# Patient Record
Sex: Female | Born: 1970
Health system: Southern US, Community
[De-identification: ages and names within clinical notes are randomized; demographics above are authoritative.]

## PROBLEM LIST (undated history)

## (undated) DIAGNOSIS — J45909 Unspecified asthma, uncomplicated: Secondary | ICD-10-CM

## (undated) DIAGNOSIS — T7840XA Allergy, unspecified, initial encounter: Secondary | ICD-10-CM

## (undated) HISTORY — DX: Unspecified asthma, uncomplicated: J45.909

## (undated) HISTORY — DX: Allergy, unspecified, initial encounter: T78.40XA

## (undated) HISTORY — PX: TONSILLECTOMY: SUR1361

---

## 2001-05-06 ENCOUNTER — Emergency Department (HOSPITAL_COMMUNITY): Admission: EM | Admit: 2001-05-06 | Discharge: 2001-05-06 | Payer: Self-pay | Admitting: Emergency Medicine

## 2003-11-03 ENCOUNTER — Inpatient Hospital Stay (HOSPITAL_COMMUNITY): Admission: AD | Admit: 2003-11-03 | Discharge: 2003-11-04 | Payer: Self-pay | Admitting: Emergency Medicine

## 2004-09-22 ENCOUNTER — Other Ambulatory Visit: Admission: RE | Admit: 2004-09-22 | Discharge: 2004-09-22 | Payer: Self-pay | Admitting: Obstetrics and Gynecology

## 2004-10-27 ENCOUNTER — Ambulatory Visit (HOSPITAL_COMMUNITY): Admission: RE | Admit: 2004-10-27 | Discharge: 2004-10-27 | Payer: Self-pay | Admitting: Obstetrics and Gynecology

## 2005-04-09 ENCOUNTER — Emergency Department (HOSPITAL_COMMUNITY): Admission: EM | Admit: 2005-04-09 | Discharge: 2005-04-10 | Payer: Self-pay | Admitting: Emergency Medicine

## 2005-11-26 IMAGING — CR DG CHEST 2V
2 series · 2 of 2 positions shown · non-contrast
Comparison: none

CLINICAL DATA: Chest pain and shortness of breath.  History of asthma.
 CHEST ? 2 VIEWS:
 Hyperaeration of the lungs  Pectus excavatum deformity compressing central aspect of the heart and accentuating its transverse diameter.  Peribronchial thickening likely chronic.  It is difficult to exclude mild vascular congestion.  Comparison will be made with prior chest x-ray dated 11/04/03 and addendum report rendered.

[view not recorded (1 of 2)]
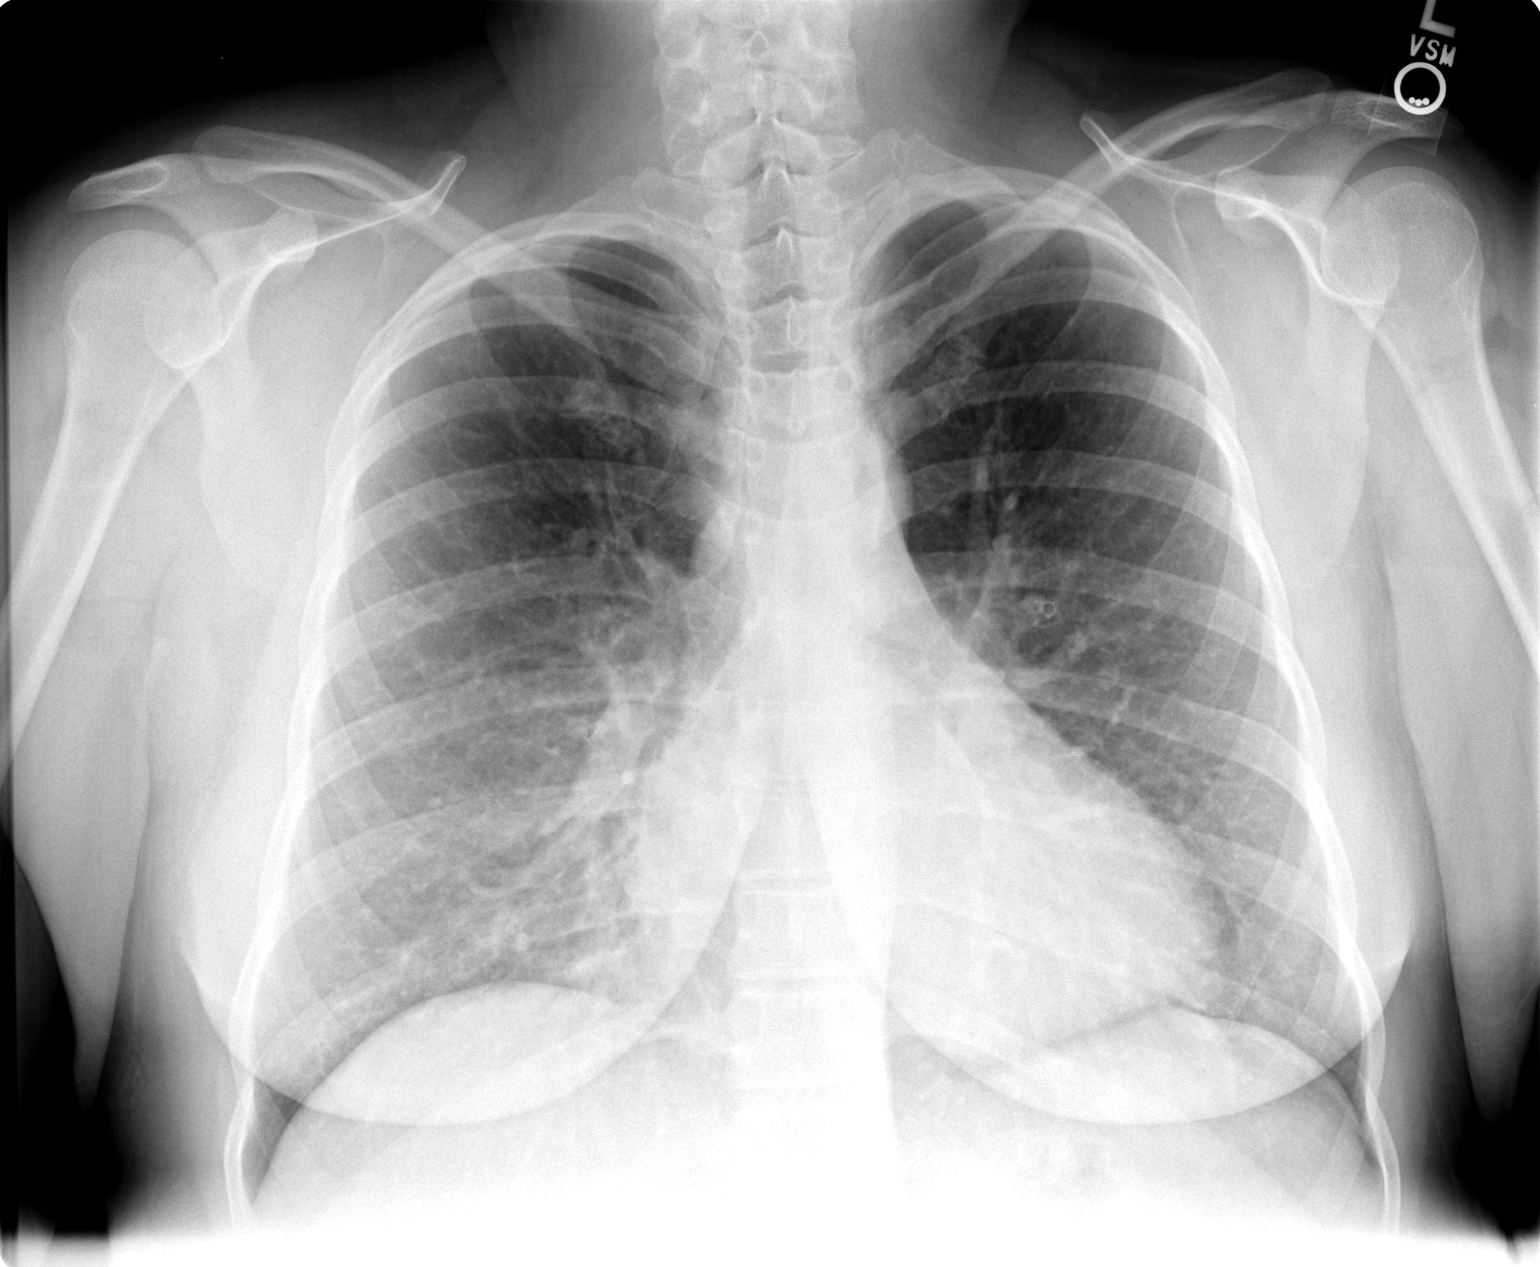

[view not recorded (2 of 2)]
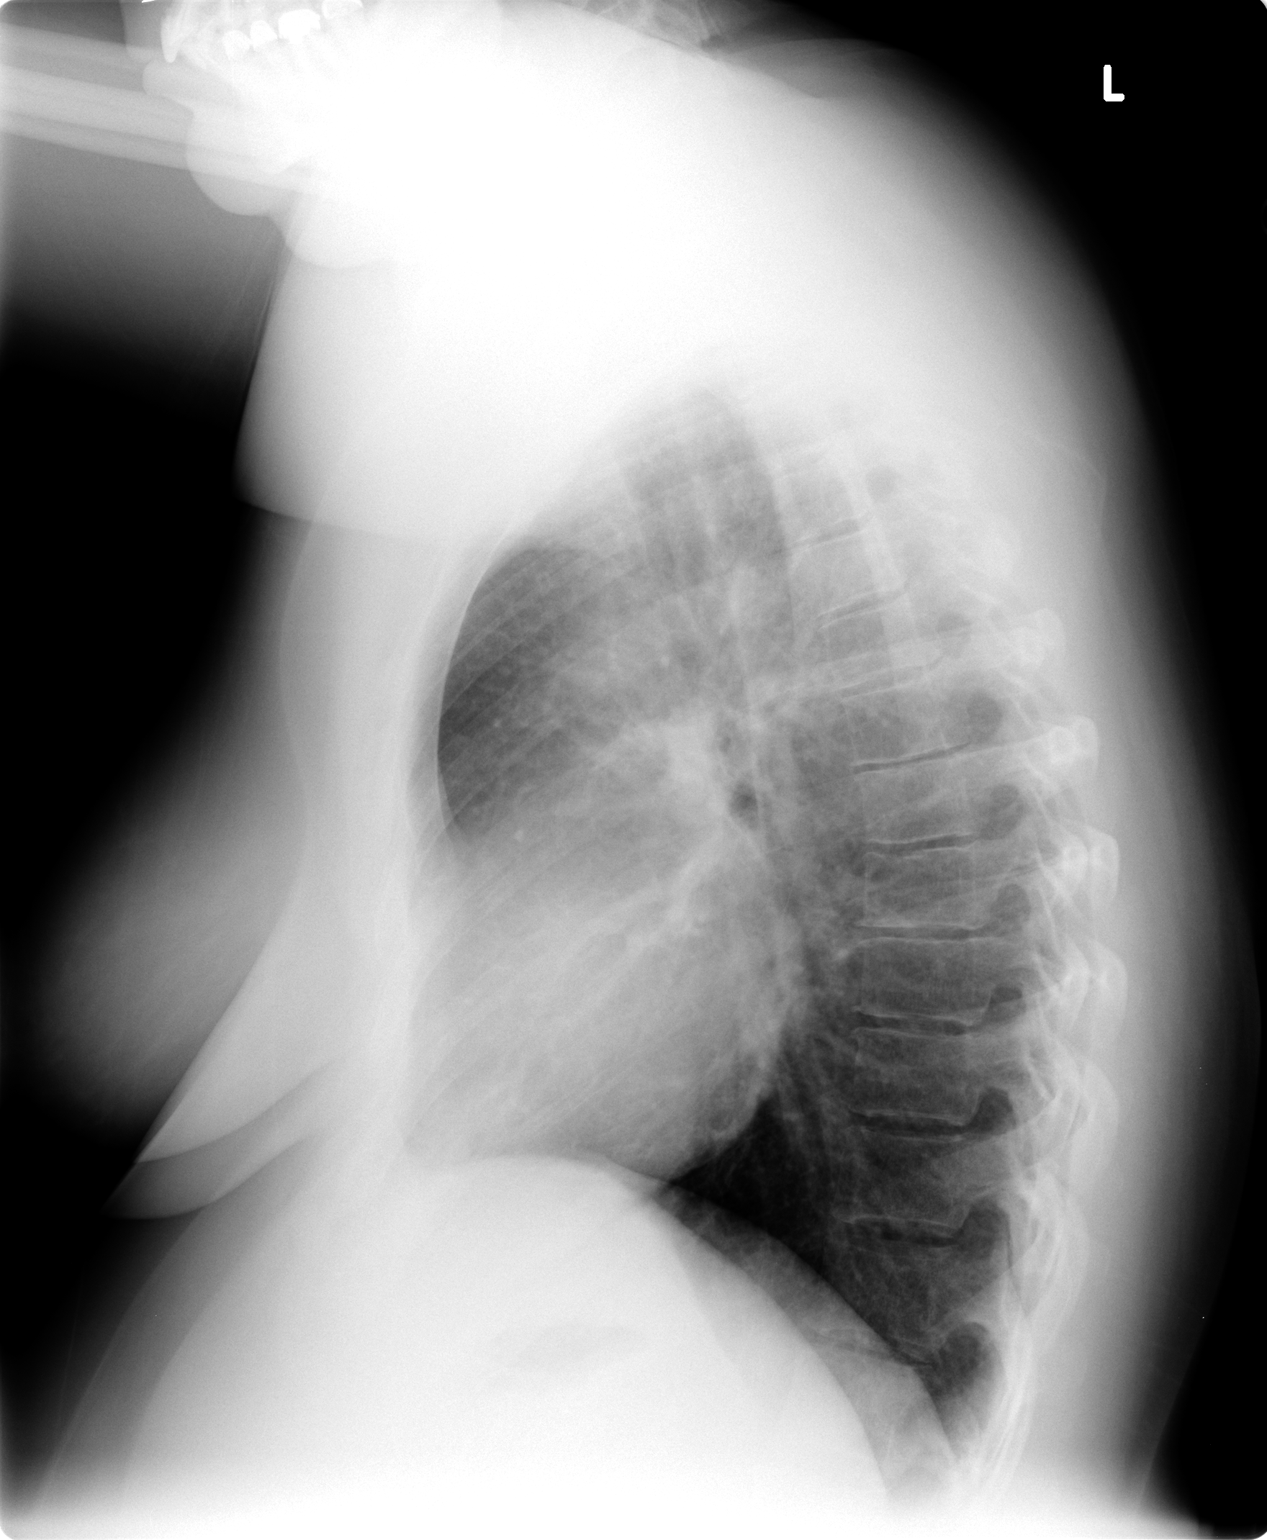

[2 of 2 positions shown; findings below may reference images not displayed]

IMPRESSION: Findings compatible with asthma/bronchitis.  Heart is likely mildly enlarged but appears accentuated on PA view due to effects of pectus excavatum.  Query an element of vascular congestion.  No overt pulmonary edema.  Bronchitic changes.  Hyperaeration.

## 2008-04-30 ENCOUNTER — Ambulatory Visit: Payer: Self-pay

## 2011-05-26 HISTORY — PX: ACHILLES TENDON LENGTHENING: SUR826

## 2011-07-10 ENCOUNTER — Encounter: Payer: Self-pay | Admitting: Podiatry

## 2011-08-01 ENCOUNTER — Encounter: Payer: Self-pay | Admitting: Podiatry

## 2011-09-01 ENCOUNTER — Encounter: Payer: Self-pay | Admitting: Podiatry

## 2011-11-06 ENCOUNTER — Encounter: Payer: Self-pay | Admitting: Podiatry

## 2011-12-01 ENCOUNTER — Encounter: Payer: Self-pay | Admitting: Podiatry

## 2012-01-01 ENCOUNTER — Encounter: Payer: Self-pay | Admitting: Podiatry

## 2012-02-26 ENCOUNTER — Encounter: Payer: Self-pay | Admitting: Podiatry

## 2012-02-29 ENCOUNTER — Encounter: Payer: Self-pay | Admitting: Podiatry

## 2012-03-31 ENCOUNTER — Encounter: Payer: Self-pay | Admitting: Podiatry

## 2012-04-30 ENCOUNTER — Encounter: Payer: Self-pay | Admitting: Podiatry

## 2012-05-31 ENCOUNTER — Encounter: Payer: Self-pay | Admitting: Podiatry

## 2017-02-13 ENCOUNTER — Encounter: Payer: Self-pay | Admitting: Emergency Medicine

## 2017-02-13 ENCOUNTER — Emergency Department
Admission: EM | Admit: 2017-02-13 | Discharge: 2017-02-13 | Disposition: A | Discharge status: Home / Self Care | Payer: 59 | Attending: Emergency Medicine | Admitting: Emergency Medicine

## 2017-02-13 DIAGNOSIS — R05 Cough: Secondary | ICD-10-CM | POA: Diagnosis present

## 2017-02-13 DIAGNOSIS — J111 Influenza due to unidentified influenza virus with other respiratory manifestations: Secondary | ICD-10-CM | POA: Insufficient documentation

## 2017-02-13 DIAGNOSIS — F17200 Nicotine dependence, unspecified, uncomplicated: Secondary | ICD-10-CM | POA: Diagnosis not present

## 2017-02-13 DIAGNOSIS — J4521 Mild intermittent asthma with (acute) exacerbation: Secondary | ICD-10-CM | POA: Insufficient documentation

## 2017-02-13 MED ORDER — ACETAMINOPHEN 500 MG PO TABS
ORAL_TABLET | ORAL | Status: AC
  Filled 2017-02-13: qty 2

## 2017-02-13 MED ORDER — IPRATROPIUM-ALBUTEROL 0.5-2.5 (3) MG/3ML IN SOLN
RESPIRATORY_TRACT | Status: AC
  Administered 2017-02-13: 3 mL via RESPIRATORY_TRACT
  Filled 2017-02-13: qty 3

## 2017-02-13 MED ORDER — METHYLPREDNISOLONE SODIUM SUCC 125 MG IJ SOLR
125.0000 mg | Freq: Once | INTRAMUSCULAR | Status: AC
  Administered 2017-02-13: 125 mg via INTRAMUSCULAR

## 2017-02-13 MED ORDER — OSELTAMIVIR PHOSPHATE 75 MG PO CAPS: 75.0000 mg | ORAL_CAPSULE | Freq: Two times a day (BID) | ORAL | 0 refills | Status: AC

## 2017-02-13 MED ORDER — PREDNISONE 10 MG (21) PO TBPK: 10.0000 mg | ORAL_TABLET | Freq: Every day | ORAL | 0 refills | Status: AC

## 2017-02-13 MED ORDER — METHYLPREDNISOLONE SODIUM SUCC 125 MG IJ SOLR
INTRAMUSCULAR | Status: AC
  Administered 2017-02-13: 125 mg via INTRAMUSCULAR
  Filled 2017-02-13: qty 2

## 2017-02-13 MED ORDER — IPRATROPIUM-ALBUTEROL 0.5-2.5 (3) MG/3ML IN SOLN
3.0000 mL | Freq: Once | RESPIRATORY_TRACT | Status: AC
  Administered 2017-02-13: 3 mL via RESPIRATORY_TRACT

## 2017-02-13 MED ORDER — ACETAMINOPHEN 500 MG PO TABS
1000.0000 mg | ORAL_TABLET | Freq: Once | ORAL | Status: AC
  Administered 2017-02-13: 1000 mg via ORAL

## 2017-02-13 NOTE — ED Provider Notes (Signed)
[facility] Emergency Department Provider Note  ____________________________________________  Time seen: Approximately 10:12 PM  I have reviewed the triage vital signs and the nursing notes.   HISTORY  Chief Complaint Fever; Cough; and Chills    HPI Mardene Lessig Scaff is a 46 y.o. female with a history of asthma presents to the emergency department with headache, nonproductive cough, SOB, congestion, fever, myalgias and chills that started today. Fever has been as high as 102F assessed orally. Patient states that she is tolerating fluids by mouth. She has had diminished appetite and energy. No recent travel. Patient denies chest pain, chest tightness, abdominal pain, nausea and vomiting. Patient has taken Ibuprofen and mucinex.     History reviewed. No pertinent past medical history.  There are no active problems to display for this patient.   History reviewed. No pertinent surgical history.  Prior to Admission medications   Medication Sig Start Date End Date Taking? Authorizing Provider  oseltamivir (TAMIFLU) 75 MG capsule Take 1 capsule (75 mg total) by mouth 2 (two) times daily. 02/13/17 02/18/17  Orvil Feil, PA-C    Allergies Patient has no known allergies.  History reviewed. No pertinent family history.  Social History Social History  Substance Use Topics  . Smoking status: Current Every Day Smoker  . Smokeless tobacco: Never Used  . Alcohol use No     Review of Systems  Constitutional: Patient has had fever.  Eyes: No visual changes. No discharge ENT: Patient has had congestion.  Cardiovascular: no chest pain. Respiratory: Patient has had non-productive cough.  No SOB. Gastrointestinal: Patient denies nausea and diarrhea. Genitourinary: Negative for dysuria. No hematuria Musculoskeletal: Patient has had myalgias. Skin: Negative for rash, abrasions, lacerations, ecchymosis. Neurological: Patient has headache, no focal weakness or  numbness. ____________________________________________   PHYSICAL EXAM:  VITAL SIGNS: ED Triage Vitals [02/13/17 2035]  Enc Vitals Group     BP (!) 141/55     Pulse Rate 90     Resp 16     Temp (!) 102.9 F (39.4 C)     Temp Source Oral     SpO2 99 %     Weight 240 lb (108.9 kg)     Height 5\' 10"  (1.778 m)     Head Circumference      Peak Flow      Pain Score      Pain Loc      Pain Edu?      Excl. in GC?    Constitutional: Alert and oriented. Patient is lying supine in bed.  Eyes: Conjunctivae are normal. PERRL. EOMI. Head: Atraumatic. ENT:      Ears: Tympanic membranes are injected bilaterally without evidence of effusion or purulent exudate. Bony landmarks are visualized bilaterally. No pain with palpation at the tragus.      Nose: Nasal turbinates are edematous and erythematous. Copious rhinorrhea visualized.      Mouth/Throat: Mucous membranes are moist. Posterior pharynx is mildly erythematous. No tonsillar hypertrophy or purulent exudate. Uvula is midline. Neck: Full range of motion. No pain is elicited with flexion at the neck. Hematological/Lymphatic/Immunilogical: No cervical lymphadenopathy. Cardiovascular: Normal rate, regular rhythm. Normal S1 and S2.  Good peripheral circulation. Respiratory: Normal respiratory effort without tachypnea or retractions. Patient has mild wheezing auscultated at the lung bases bilaterally. Wheezing improved to auscultation after DuoNeb treatment. Good air entry to the bases with no decreased or absent breath sounds. Gastrointestinal: Bowel sounds 4 quadrants. Soft and nontender to palpation. No guarding  or rigidity. No palpable masses. No distention. No CVA tenderness.  Skin:  Skin is warm, dry and intact. No rash noted. Psychiatric: Mood and affect are normal. Speech and behavior are normal. Patient exhibits appropriate insight and judgement. ____________________________________________   LABS (all labs ordered are listed, but  only abnormal results are displayed)  Labs Reviewed - No data to display ____________________________________________  EKG   ____________________________________________  RADIOLOGY   No results found.  ____________________________________________    PROCEDURES  Procedure(s) performed:    Procedures    Medications  acetaminophen (TYLENOL) tablet 1,000 mg (1,000 mg Oral Given 02/13/17 2038)  methylPREDNISolone sodium succinate (SOLU-MEDROL) 125 mg/2 mL injection 125 mg (125 mg Intramuscular Given 02/13/17 2216)  ipratropium-albuterol (DUONEB) 0.5-2.5 (3) MG/3ML nebulizer solution 3 mL (3 mLs Nebulization Given 02/13/17 2217)     ____________________________________________   INITIAL IMPRESSION / ASSESSMENT AND PLAN / ED COURSE  Pertinent labs & imaging results that were available during my care of the patient were reviewed by me and considered in my medical decision making (see chart for details).  Review of the [place] CSRS was performed in accordance of the [facility] prior to dispensing any controlled drugs.     Assessment and Plan:  Influenza: Asthma Exacerbation  Patient presents to the emergency department with headache, rhinorrhea, congestion, SOB, myalgias, fatigue and diarrhea. Symptoms are consistent with influenza. On physical exam, patient had mild diffuse wheezing auscultated at the lung bases, which improved to auscultation after DuoNeb treatment. She was given Solu-Medrol in the emergency department. Patient was discharged with tapered prednisone. Tamiflu was also prescribed at discharge. Rest and hydration were encouraged. Patient was advised to follow-up with her primary care provider in one week. Physical exam vital signs are reassuring at this time. All patient questions were answered.   ____________________________________________  FINAL CLINICAL IMPRESSION(S) / ED DIAGNOSES  Final diagnoses:  Influenza  Mild intermittent asthma with exacerbation       NEW MEDICATIONS STARTED DURING THIS VISIT:  New Prescriptions   OSELTAMIVIR (TAMIFLU) 75 MG CAPSULE    Take 1 capsule (75 mg total) by mouth 2 (two) times daily.        This chart was dictated using voice recognition software/Dragon. Despite best efforts to proofread, errors can occur which can change the meaning. Any change was purely unintentional.    Orvil FeilJaclyn M Woods, PA-C 02/14/17 0020    Governor Rooksebecca Lord, MD 02/16/17 [id number]

## 2017-02-13 NOTE — ED Notes (Signed)
See triage note, pt reports today she woke up feeling sore, having a fever as well as a cough. Pt's significant other is in room and reports he has just gotten over the same. Pt states she has taken mucinex, ibuprofen without relief. Pt denies emesis. Pt states she is a smoker.

## 2017-02-13 NOTE — ED Triage Notes (Signed)
Pt to triage in wheelchair. Pt c/o cough, fever, chills x1 day. Pt sts she last took tylenol at 1600 today. Pt has fever of 102.39F in triage.

## 2018-02-20 ENCOUNTER — Other Ambulatory Visit: Payer: Self-pay

## 2018-02-20 ENCOUNTER — Ambulatory Visit: Payer: 59 | Attending: Orthopedic Surgery | Admitting: Physical Therapy

## 2018-02-20 ENCOUNTER — Encounter: Payer: Self-pay | Admitting: Physical Therapy

## 2018-02-20 DIAGNOSIS — M6281 Muscle weakness (generalized): Secondary | ICD-10-CM | POA: Diagnosis present

## 2018-02-20 DIAGNOSIS — R262 Difficulty in walking, not elsewhere classified: Secondary | ICD-10-CM | POA: Diagnosis present

## 2018-02-21 NOTE — Therapy (Signed)
[facility] [address] [place], [address] Phone: [phone]   Fax:  [phone]  Physical Therapy Evaluation  Patient Details  Name: Stephanie Pugh MRN: [id number] Date of Birth: 1971/10/23 Referring Provider: Margarita Rana MD   Encounter Date: 02/20/2018  PT End of Session - 02/20/18 1908    Visit Number  1    Number of Visits  12    Date for PT Re-Evaluation  04/03/18    PT Start Time  1750    PT Stop Time  1900    PT Time Calculation (min)  70 min    Activity Tolerance  Patient tolerated treatment well    Behavior During Therapy  [facility] for tasks assessed/performed       Past Medical History:  Diagnosis Date  . Allergy   . Asthma     Past Surgical History:  Procedure Laterality Date  . ACHILLES TENDON LENGTHENING Left 05/26/2011    There were no vitals filed for this visit.   Subjective Assessment - 02/20/18 1814    Subjective  Patient reports she is having difficulty with being up on feet with prolonged standing 30 - 60 minutes, difficulty with stairs, balance and is unable to run, go up on toes.     Pertinent History         may 2012 underwent Achilles tendon lengthening; She had complications during healing with falling; she was placed back in boot and went back to work, had increased pain, tearing of tendon and siince then she has had difficulty with strength, endurance, walking, standing. She was seen by Dr. Eulah Pont recently and was told no surgery was indicated and she is now referred to out patient PT for strengthening   Limitations  Patient Stated Goals  Standing;Walking;House hold activities;Other (comment) difficulty with going up on toes for any activity  to improve strength and function as much as possible    Currently in Pain?  No/denies 0/10; worst pain 8/10    Aggravating Factors   standing,, walking, going up on toes    Pain Relieving Factors  rest, ice    Effect of Pain  on Daily Activities  difficulty with walking and standing activities         [facility] PT Assessment - 02/20/18 1810      Assessment   Medical Diagnosis  Achilles Tendonopathy left     Referring Provider  Margarita Rana MD    Onset Date/Surgical Date  -- 05/2011 (Achilles lengthening) tore Achilles tendon x 2 post surgery    Prior Therapy  yes; following surgery and following tendon tear      Precautions   Precautions  None      Restrictions   Weight Bearing Restrictions  No      Balance Screen   Has the patient fallen in the past 6 months  No      Prior Function   Level of Independence  Independent    Vocation  Full time employment    Vocation Requirements  standing, climbing ladders, walking     Leisure  going out       Cognition   Overall Cognitive Status  Within Functional Limits for tasks assessed      Observation/Other Assessments   Focus on Therapeutic Outcomes (FOTO)   45    Other Surveys   -- FADI 26%      ROM / Strength   AROM / PROM / Strength  AROM;Strength      AROM   Overall AROM Comments  bilateral LE's grossly WFL throughout       Strength   Overall Strength Comments  right LE WNL major muscle groups; left LE hip abduction 4-/5, ER 4-/5, extension 4-/5, knee extensoin 4-/5, flexion 4-/5, ankle DF 4/5, eversion 3/5, PF 3-/5      Palpation   Palpation comment  decreased soft tissue elasticity left ankle, Achilles tendon, calf muscle      Ambulation/Gait   Gait Comments  decreased trunk rotation, decreased push off left LE      Balance   Balance Assessed  Yes single leg balance left 22 seconds, right 22 seconds         Objective measurements completed on examination: See above findings.    Treatment: Therapeutic exercise: patient performed with demonstration, verbal and tactil cues of therapist: goal; improved function with standing, daily tasks, independent with home program  Sitting: SLR with foot everted and knee relaxed with quad set  controlled; x 5 Seated hip adduction with ball and glute sets x 10 Hip abduction with resistive band x 15 Bilateral ankle eversion with yellow resistive band, DF with yellow resistive band x 15 each  Patient response to treatment: patient demonstrated improved technique with exercises with minimal VC for correct alignment.    PT Education - 02/20/18 1946    Education provided  Yes    Education Details  HEP; POC: seated hip adduction with glute sets, hip abduction with resistive band, ankle eversion wtih resistive band, SLR    Person(s) Educated  Patient    Methods  Explanation;Demonstration;Verbal cues;Handout    Comprehension  Verbal cues required;Returned demonstration;Verbalized understanding          PT Long Term Goals - 02/20/18 1915      PT LONG TERM GOAL #1   Title  Patient will demonstrate improved function with daily tasks as indicated by FOTO score of 50 and foot/ankle disability index score of 25% or better    Baseline  FOTO 45; FADI 36%    Status  New    Target Date  03/13/18      PT LONG TERM GOAL #2   Title  Patient will demonstrate improved function with daily tasks as indicated by FOTO score of 55 and foot/ankle disability index score of 20% or better    Baseline  FOTO 45; FADI 36%    Status  New    Target Date  04/03/18      PT LONG TERM GOAL #3   Title  Patient will be independent with home program for strength, balance, flexibility to transition to self management at discharge     Baseline  limited knowledge of appropriate exerises, progression and requires assistance, cuing    Status  New    Target Date  04/03/18             Plan - 02/20/18 1931    Clinical Impression Statement  Patient is a 47 year old female who presents with left ankle weakness, decreased endurance and limited function with daily tasks. She has FOTO score of 45 and a foot/ankle disability index of 36% indicating moderate self perceived disability. She has left LE weakness and  decreased balance and limited knowledge of appropriate exercises and progression and will benefit from physical therapy intervention.     History and Personal Factors relevant to plan of care:  May 2012 underwent Achilles tendon lengthening; She had complications during healing with  falling; she was placed back in boot and went back to work, had increased pain, tearing of tendon and siince then she has had difficulty with strength, endurance, walking, standing. She was seen by Dr. Eulah PontMurphy recently and is now referred to out patient PT for strengthening.     Clinical Presentation  Stable    Clinical Presentation due to:  no changes in pain, weakness    Clinical Decision Making  Low    Rehab Potential  Good    Clinical Impairments Affecting Rehab Potential  (+)motivated, age (-) chronic condition, multiple injuries and surgery left snkle >5 years ago    PT Frequency  2x / week    PT Duration  6 weeks    PT Treatment/Interventions  Manual techniques;Neuromuscular re-education;Patient/family education;Balance training;Therapeutic exercise;Moist Heat;Ultrasound;Electrical Stimulation;Cryotherapy    PT Next Visit Plan  modalities for neuromuscular re education; therapeutic exercises    PT Home Exercise Plan  hip adduction with glute sets, hip abduction with resistive band, SLR, ankle eversion with resistive band    Consulted and Agree with Plan of Care  Patient       Patient will benefit from skilled therapeutic intervention in order to improve the following deficits and impairments:  Pain, Decreased activity tolerance, Decreased endurance, Decreased strength, Impaired perceived functional ability, Difficulty walking, Decreased balance  Visit Diagnosis: Muscle weakness (generalized) - Plan: PT plan of care cert/re-cert  Difficulty in walking, not elsewhere classified - Plan: PT plan of care cert/re-cert     Problem List There are no active problems to display for this patient.   Beacher MayBrooks, Kei Mcelhiney  PT 02/21/2018, 8:24 PM  [facility] [address] [place], [place], [address] Phone: [phone]   Fax:  [phone]  Name: Randon GoldsmithDarla D Pugh MRN: [id number] Date of Birth: [birth year]

## 2018-02-24 ENCOUNTER — Encounter: Payer: Self-pay | Admitting: Physical Therapy

## 2018-02-24 ENCOUNTER — Ambulatory Visit: Payer: 59 | Admitting: Physical Therapy

## 2018-02-24 DIAGNOSIS — M6281 Muscle weakness (generalized): Secondary | ICD-10-CM | POA: Diagnosis not present

## 2018-02-24 DIAGNOSIS — R262 Difficulty in walking, not elsewhere classified: Secondary | ICD-10-CM

## 2018-02-24 NOTE — Therapy (Signed)
[facility] [address] [place], [address] Phone: [phone]   Fax:  [phone]  Physical Therapy Treatment  Patient Details  Name: Stephanie Pugh MRN: [id number] Date of Birth: 1971/10/10 Referring Provider: Margarita Rana MD   Encounter Date: 02/24/2018  PT End of Session - 02/24/18 1904    Visit Number  2    Number of Visits  12    Date for PT Re-Evaluation  04/03/18    Authorization Type  2 of 6 FOTO    PT Start Time  1824    PT Stop Time  1904    PT Time Calculation (min)  40 min    Activity Tolerance  Patient tolerated treatment well    Behavior During Therapy  [facility] for tasks assessed/performed       Past Medical History:  Diagnosis Date  . Allergy   . Asthma     Past Surgical History:  Procedure Laterality Date  . ACHILLES TENDON LENGTHENING Left 05/26/2011    There were no vitals filed for this visit.  Subjective Assessment - 02/24/18 1826    Subjective  Patient reports no problems since previous session    Pertinent History  May 2012 underwent Achilles tendon lengthening; She had complications during healing with falling; she was placed back in boot and went back to work, had increased pain, tearing of tendon and siince then she has had difficulty with strength, endurance, walking, standing. She was seen by Dr. Eulah Pont recently and was told no surgery was indicated and she is now referred to out patient PT for strengthening.     Limitations  Standing;Walking;House hold activities;Other (comment) difficulty with going up on toes for any activity    Patient Stated Goals  to improve strength and function as much as possible     Currently in Pain?  No/denies       Objective:  Treatment: Therapeutic exercise: patient performed with demonstration, verbal and tactile cues of therapist: goal: strength, improve function as indicated by FOTO, FADI, independent with home program   Seated in  chair: PF with green and red band 15 with each   Long sitting: isometric PF and eversion with graded resistance of therapist x 10 Reps (patient reported moderate difficulty and fatigue) Foot intrinsic exercises with manual resistance to toe flexion/extension: ankle DF with toe flexion, ankle PF with toe extension x 5 reps then patient performed without resistance x 10 reps  Leg press 35# single leg each LE x 15, bilateral 55#  X 15 with patient reporting fatigue   Side stepping along airex balance beam 2 min. With patient using UE's support for balance  Hip rotary exercise 85# hip extension right, 70# with left LE due to c/o back pain on right x 15 reps Hip abduction 40# each LE x 10 reps  Patient response to treatment: Patient demonstrated improved technique with exercises with minimal guidance, demonstration and VC for correct alignment. Mild fatigue noted with all exercises. Moderate fatigue with ankle PF and eversion     PT Education - 02/24/18 1828    Education provided  Yes    Education Details  exercise instruction for technique    Person(s) Educated  Patient    Methods  Explanation;Demonstration;Verbal cues    Comprehension  Verbalized understanding;Returned demonstration;Verbal cues required          PT Long Term Goals - 02/20/18 1915      PT LONG TERM GOAL #1  Title  Patient will demonstrate improved function with daily tasks as indicated by FOTO score of 50 and foot/ankle disability index score of 25% or better    Baseline  FOTO 45; FADI 36%    Status  New    Target Date  03/13/18      PT LONG TERM GOAL #2   Title  Patient will demonstrate improved function with daily tasks as indicated by FOTO score of 55 and foot/ankle disability index score of 20% or better    Baseline  FOTO 45; FADI 36%    Status  New    Target Date  04/03/18      PT LONG TERM GOAL #3   Title  Patient will be independent with home program for strength, balance, flexibility to transition  to self management at discharge     Baseline  limited knowledge of appropriate exerises, progression and requires assistance, cuing    Status  New    Target Date  04/03/18            Plan - 02/24/18 1924    Clinical Impression Statement  Patient was able to perform exercises with guidance and demonstration and verbal cuing of therapist. She has decreased endurance with exercises and decreased strength left LE. She will benefit from continued physical therapy intervention to achieve goals.     Rehab Potential  Good    Clinical Impairments Affecting Rehab Potential  (+)motivated, age (-) chronic condition, multiple injuries and surgery left snkle >5 years ago    PT Frequency  2x / week    PT Duration  6 weeks    PT Treatment/Interventions  Manual techniques;Neuromuscular re-education;Patient/family education;Balance training;Therapeutic exercise;Moist Heat;Ultrasound;Electrical Stimulation;Cryotherapy    PT Next Visit Plan  modalities for neuromuscular re education; therapeutic exercises; balance, hip rotary machine, leg press    PT Home Exercise Plan  hip adduction with glute sets, hip abduction with resistive band, SLR, ankle eversion with resistive band, toe flexion with ankle DF/toe extension with PF,        Patient will benefit from skilled therapeutic intervention in order to improve the following deficits and impairments:  Pain, Decreased activity tolerance, Decreased endurance, Decreased strength, Impaired perceived functional ability, Difficulty walking, Decreased balance  Visit Diagnosis: Muscle weakness (generalized)  Difficulty in walking, not elsewhere classified     Problem List There are no active problems to display for this patient.   Beacher MayBrooks, Jerae Izard PT 02/25/2018, 6:19 PM  [facility] [address] [place], [place], [address] Phone: [phone]   Fax:  [phone]  Name: Stephanie Pugh MRN:  [id number] Date of Birth: [birth year]

## 2018-02-27 ENCOUNTER — Encounter: Payer: Self-pay | Admitting: Physical Therapy

## 2018-02-27 ENCOUNTER — Ambulatory Visit: Payer: 59 | Admitting: Physical Therapy

## 2018-02-27 DIAGNOSIS — M6281 Muscle weakness (generalized): Secondary | ICD-10-CM

## 2018-02-27 DIAGNOSIS — R262 Difficulty in walking, not elsewhere classified: Secondary | ICD-10-CM

## 2018-02-27 NOTE — Therapy (Signed)
[facility] [facility] PH[facility] MEDICINE [address] [place], [place], [address] Phone: [phone]   Fax:  [phone]  Physical Therapy Treatment  Patient Details  Name: Stephanie GoldsmithDarla D Pugh MRN: [id number] Date of Birth: 1971/12/17 Referring Provider: Margarita RanaMurphy, Timothy MD   Encounter Date: 02/27/2018  PT End of Session - 02/27/18 1755    Visit Number  3    Number of Visits  12    Date for PT Re-Evaluation  04/03/18    Authorization Type  3 of 6 FOTO    PT Start Time  1750    PT Stop Time  1838    PT Time Calculation (min)  48 min    Activity Tolerance  Patient tolerated treatment well    Behavior During Therapy  [facility] for tasks assessed/performed       Past Medical History:  Diagnosis Date  . Allergy   . Asthma     Past Surgical History:  Procedure Laterality Date  . ACHILLES TENDON LENGTHENING Left 05/26/2011    There were no vitals filed for this visit.  Subjective Assessment - 02/27/18 1753    Subjective  Patient reports increased soreness in left LE from exercises.     Pertinent History  May 2012 underwent Achilles tendon lengthening; She had complications during healing with falling; she was placed back in boot and went back to work, had increased pain, tearing of tendon and siince then she has had difficulty with strength, endurance, walking, standing. She was seen by Dr. Eulah PontMurphy recently and was told no surgery was indicated and she is now referred to out patient PT for strengthening.     Limitations  Standing;Walking;House hold activities;Other (comment) difficulty with going up on toes for any activity    Patient Stated Goals  to improve strength and function as much as possible     Currently in Pain?  Yes    Pain Score  2     Pain Location  Back    Pain Orientation  Lower    Pain Descriptors / Indicators  Sore    Pain Type  Chronic pain;Acute pain    Pain Onset  More than a month ago    Pain Frequency  Intermittent          Objective:  Treatment: Therapeutic exercise: patient performed with demonstration, verbal and tactile cues of therapist: goal: strength, improve function as indicated by FOTO, FADI, independent with home program   Long sitting: isometric PF, DF and eversion with graded resistance of therapist x 10 Reps (patient reported moderate difficulty and fatigue) Foot intrinsic exercises with manual resistance to toe flexion/extension: ankle DF with toe flexion, ankle PF with toe extension x 10 reps  Leg press 25# single leg each LE 2 x 15, bilateral 55#  X 15 with patient reporting fatigue   Side stepping along airex balance beam 2 min. With patient using UE's support for balance  Hip rotary exercise 85# hip extension right, 85# with left LE due to c/o back pain on right x 15 reps Hip abduction 40# each LE x 10 reps  Core strengthening at OMEGA: Standing scapular rows with 10# x 15 Standing straight arm pull downs 15# x 12 reps  Patient response to treatment: Patient with mild fatigue with exercises and able to perform all exercises with minimal cuing and assistance for proper technique and alignment     PT Education - 02/27/18 1921    Education provided  Yes  Education Details  exercise instruction for technique    Person(s) Educated  Patient    Methods  Explanation;Demonstration;Verbal cues    Comprehension  Verbalized understanding;Returned demonstration;Verbal cues required          PT Long Term Goals - 02/20/18 1915      PT LONG TERM GOAL #1   Title  Patient will demonstrate improved function with daily tasks as indicated by FOTO score of 50 and foot/ankle disability index score of 25% or better    Baseline  FOTO 45; FADI 36%    Status  New    Target Date  03/13/18      PT LONG TERM GOAL #2   Title  Patient will demonstrate improved function with daily tasks as indicated by FOTO score of 55 and foot/ankle disability index score of 20% or better    Baseline  FOTO 45;  FADI 36%    Status  New    Target Date  04/03/18      PT LONG TERM GOAL #3   Title  Patient will be independent with home program for strength, balance, flexibility to transition to self management at discharge     Baseline  limited knowledge of appropriate exerises, progression and requires assistance, cuing    Status  New    Target Date  04/03/18            Plan - 02/27/18 1911    Clinical Impression Statement  Patient demonstrates improvement with exercises with less cuing and increased endurance.     Rehab Potential  Good    Clinical Impairments Affecting Rehab Potential  (+)motivated, age (-) chronic condition, multiple injuries and surgery left snkle >5 years ago    PT Frequency  2x / week    PT Duration  6 weeks    PT Treatment/Interventions  Manual techniques;Neuromuscular re-education;Patient/family education;Balance training;Therapeutic exercise;Moist Heat;Ultrasound;Electrical Stimulation;Cryotherapy    PT Next Visit Plan  modalities for neuromuscular re education; therapeutic exercises; balance, hip rotary machine, leg press    PT Home Exercise Plan  hip adduction with glute sets, hip abduction with resistive band, SLR, ankle eversion with resistive band, toe flexion with ankle DF/toe extension with PF,        Patient will benefit from skilled therapeutic intervention in order to improve the following deficits and impairments:  Pain, Decreased activity tolerance, Decreased endurance, Decreased strength, Impaired perceived functional ability, Difficulty walking, Decreased balance  Visit Diagnosis: Muscle weakness (generalized)  Difficulty in walking, not elsewhere classified     Problem List There are no active problems to display for this patient.   Beacher May PT 02/27/2018, 7:24 PM  [facility] PHYSICAL AND SPORTS MEDICINE [address], [place], [address] Phone: [phone]   Fax:  [phone]  Name: Stephanie Pugh MRN: [id number] Date of Birth: 11-18-1971

## 2018-03-03 ENCOUNTER — Ambulatory Visit: Payer: 59 | Attending: Orthopedic Surgery | Admitting: Physical Therapy

## 2018-03-03 ENCOUNTER — Ambulatory Visit: Payer: 59 | Admitting: Physical Therapy

## 2018-03-03 ENCOUNTER — Encounter: Payer: Self-pay | Admitting: Physical Therapy

## 2018-03-03 DIAGNOSIS — M6281 Muscle weakness (generalized): Secondary | ICD-10-CM | POA: Insufficient documentation

## 2018-03-03 DIAGNOSIS — R262 Difficulty in walking, not elsewhere classified: Secondary | ICD-10-CM | POA: Insufficient documentation

## 2018-03-03 NOTE — Therapy (Signed)
[place] [facility] [address] [place], [place], [address] Phone: [phone]   Fax:  [phone]  Physical Therapy Treatment  Patient Details  Name: Stephanie GoldsmithDarla D Pugh MRN: [id number] Date of Birth: [birth year] Referring Provider: Margarita RanaMurphy, Timothy MD   Encounter Date: 03/03/2018  PT End of Session - 03/03/18 1846    Visit Number  4    Number of Visits  12    Date for PT Re-Evaluation  04/03/18    Authorization Type  4 of 6 FOTO    PT Start Time  1845    PT Stop Time  1927    PT Time Calculation (min)  42 min    Activity Tolerance  Patient tolerated treatment well    Behavior During Therapy  [facility] assessed/performed       Past Medical History:  Diagnosis Date  . Allergy   . Asthma     Past Surgical History:  Procedure Laterality Date  . ACHILLES TENDON LENGTHENING Left 05/26/2011    There were no vitals filed for this visit.  Subjective Assessment - 03/03/18 1844    Subjective  Patient reports she did better following previous session with less pain, soreness.     Pertinent History  May 2012 underwent Achilles tendon lengthening; She had complications during healing with falling; she was placed back in boot and went back to work, had increased pain, tearing of tendon and siince then she has had difficulty with strength, endurance, walking, standing. She was seen by Dr. Eulah PontMurphy recently and was told no surgery was indicated and she is now referred to out patient PT for strengthening.     Limitations  Standing;Walking;House hold activities;Other (comment) difficulty with going up on toes for any activity    Patient Stated Goals  to improve strength and function as much as possible     Currently in Pain?  No/denies       Objective: Strength: left LE ankle decreased eversion, DF, PF and left hip abduction and ER as compared to right LE Gait: antalgic, decreased weight shift to left   Treatment: Therapeutic  exercise:patient performed with demonstration, verbal and tactile cues of therapist: goal: strength, improve function as indicated by FOTO, FADI, independent with home program   Long sitting: isometric PF, DF and eversionwith graded resistance of therapist x10 Reps Foot intrinsic exercises with manual resistance to toe flexion/extension: ankle DF with toe flexion, ankle PF with toe extension x 10 reps  Supine: Bridging with ball partial ROM x 10  Side lying right: Clam left LE with manual resistance x 15 reps   Leg press 25# single legeach LE  x15, bilateral 55#X15, single 35# x 15 each  Side stepping along airexbalance beam 2 min. With patient using UE's support for balance  Hip rotaryexercise100# hip extension each LE  x 15; 2nd set @ 85# x 15 reps Hip abduction 55# each LE 2 x 10 reps  Core strengthening at OMEGA: Standing scapular rows with 10#  2 x 15 Standing straight arm pull downs 15# 2 x 15 reps Palloff press each side 5# x 25 reps Modified palloff facing cable with 10# x 15 reps (patient reported feeling weakness left ankle)  Patient response to treatment:Patient able to complete exercise with minimal VC for correct technique. Mild fatigue noted with exercises     PT Education - 03/03/18 1846    Education provided  Yes    Education Details  exercise instruction for  technique; blue tubing given for home exercises for Palloff press and scapular rows and straight arm pull downs    Person(s) Educated  Patient    Methods  Explanation;Demonstration;Verbal cues    Comprehension  Verbalized understanding;Returned demonstration;Verbal cues required          PT Long Term Goals - 02/20/18 1915      PT LONG TERM GOAL #1   Title  Patient will demonstrate improved function with daily tasks as indicated by FOTO score of 50 and foot/ankle disability index score of 25% or better    Baseline  FOTO 45; FADI 36%    Status  New    Target Date  03/13/18      PT  LONG TERM GOAL #2   Title  Patient will demonstrate improved function with daily tasks as indicated by FOTO score of 55 and foot/ankle disability index score of 20% or better    Baseline  FOTO 45; FADI 36%    Status  New    Target Date  04/03/18      PT LONG TERM GOAL #3   Title  Patient will be independent with home program for strength, balance, flexibility to transition to self management at discharge     Baseline  limited knowledge of appropriate exerises, progression and requires assistance, cuing    Status  New    Target Date  04/03/18            Plan - 03/03/18 1846    Clinical Impression Statement  Patient demonstrates good progress towards goals. Patient with less fatigue and advanced strengthening exercise with increased intensity.     Rehab Potential  Good    Clinical Impairments Affecting Rehab Potential  (+)motivated, age (-) chronic condition, multiple injuries and surgery left snkle >5 years ago    PT Frequency  2x / week    PT Duration  6 weeks    PT Treatment/Interventions  Manual techniques;Neuromuscular re-education;Patient/family education;Balance training;Therapeutic exercise;Moist Heat;Ultrasound;Electrical Stimulation;Cryotherapy    PT Next Visit Plan  modalities for neuromuscular re education; therapeutic exercises; balance, hip rotary machine, leg press    PT Home Exercise Plan  hip adduction with glute sets, hip abduction with resistive band, SLR, ankle eversion with resistive band, toe flexion with ankle DF/toe extension with PF,        Patient will benefit from skilled therapeutic intervention in order to improve the following deficits and impairments:  Pain, Decreased activity tolerance, Decreased endurance, Decreased strength, Impaired perceived functional ability, Difficulty walking, Decreased balance  Visit Diagnosis: Muscle weakness (generalized)  Difficulty in walking, not elsewhere classified     Problem List There are no active problems to  display for this patient.   Beacher May PT 03/03/2018, 7:38 PM   [facility] PH[facility]ICAL [facility]TS MEDICINE [address] [place], [address] Phone: [phone]   Fax:  [phone]  Name: Stephanie Pugh MRN: [id number] Date of Birth: 1971-10-21

## 2018-03-06 ENCOUNTER — Ambulatory Visit: Payer: 59 | Admitting: Physical Therapy

## 2018-03-06 ENCOUNTER — Encounter: Payer: Self-pay | Admitting: Physical Therapy

## 2018-03-06 DIAGNOSIS — M6281 Muscle weakness (generalized): Secondary | ICD-10-CM

## 2018-03-06 DIAGNOSIS — R262 Difficulty in walking, not elsewhere classified: Secondary | ICD-10-CM

## 2018-03-06 NOTE — Therapy (Signed)
[place] [facility] PH[facility]ICINE [address] [place], [place], [address] Phone: [phone]   Fax:  [phone]  Physical Therapy Treatment  Patient Details  Name: Stephanie GoldsmithDarla D Vue MRN: [id number] Date of Birth: 1971/08/16 Referring Provider: Margarita RanaMurphy, Timothy MD   Encounter Date: 03/06/2018  PT End of Session - 03/06/18 1828    Visit Number  5    Number of Visits  12    Date for PT Re-Evaluation  04/03/18    Authorization Type  5 of 6 FOTO    PT Start Time  1825    PT Stop Time  1912    PT Time Calculation (min)  47 min    Activity Tolerance  Patient tolerated treatment well    Behavior During Therapy  [facility] assessed/per[facility]       Past Medical History:  Diagnosis Date  . Allergy   . Asthma     Past Surgical History:  Procedure Laterality Date  . ACHILLES TENDON LENGTHENING Left 05/26/2011    There were no vitals filed for this visit.  Subjective Assessment - 03/06/18 1824    Subjective  Patient reports she is about the same, no problems  from previous session.     Pertinent History  May 2012 underwent Achilles tendon lengthening; She had complications during healing with falling; she was placed back in boot and went back to work, had increased pain, tearing of tendon and siince then she has had difficulty with strength, endurance, walking, standing. She was seen by Dr. Eulah PontMurphy recently and was told no surgery was indicated and she is now referred to out patient PT for strengthening.     Limitations  Standing;Walking;House hold activities;Other (comment) difficulty with going up on toes for any activity    Patient Stated Goals  to improve strength and function as much as possible     Currently in Pain?  No/denies         Objective:  Treatment: Therapeutic exercise:patient performed with demonstration, verbal and tactile cues of therapist: goal: strength, improve function as indicated by FOTO, FADI, independent with  home program   Long sitting: isometric PF, DFand eversionwith graded resistance of therapist x10 Reps  Leg press 35# bilateral x 15, bilateral 65#X15, single 35# x 15 each  Resistive band walking forward (~3-5 steps) x 5 backward x 5 reps, side step right and left 5 reps (2-3 steps)  Hip rotaryexercise100# hip extension each LE  2 x 15 Hip abduction 55# each LE 1 x 15 reps  Core strengthening at OMEGA: Standing scapular rows with 15#  2 x 15 Standing straight arm pull downs 15# 2 x 15 reps Palloff press each side 5# x 15 reps Modified palloff facing cable with 10# x 15 reps   Patient response to treatment:improved technique with minimal cuing. Increased intensity with most exercises, mild fatigue noted with exercises.    PT Education - 03/06/18 1825    Education provided  Yes    Education Details  exercise instruction for technique add resistive band walking    Person(s) Educated  Patient    Methods  Explanation;Demonstration;Verbal cues    Comprehension  Verbalized understanding;Returned demonstration;Verbal cues required          PT Long Term Goals - 02/20/18 1915      PT LONG TERM GOAL #1   Title  Patient will demonstrate improved function with daily tasks as indicated by FOTO score of 50 and foot/ankle disability index  score of 25% or better    Baseline  FOTO 45; FADI 36%    Status  New    Target Date  03/13/18      PT LONG TERM GOAL #2   Title  Patient will demonstrate improved function with daily tasks as indicated by FOTO score of 55 and foot/ankle disability index score of 20% or better    Baseline  FOTO 45; FADI 36%    Status  New    Target Date  04/03/18      PT LONG TERM GOAL #3   Title  Patient will be independent with home program for strength, balance, flexibility to transition to self management at discharge     Baseline  limited knowledge of appropriate exerises, progression and requires assistance, cuing    Status  New    Target  Date  04/03/18            Plan - 03/06/18 1829    Clinical Impression Statement  Patient progressing towards goals and is improving in strength and requires less cuing to perform exercises.     Rehab Potential  Good    Clinical Impairments Affecting Rehab Potential  (+)motivated, age (-) chronic condition, multiple injuries and surgery left snkle >5 years ago    PT Frequency  2x / week    PT Duration  6 weeks    PT Treatment/Interventions  Manual techniques;Neuromuscular re-education;Patient/family education;Balance training;Therapeutic exercise;Moist Heat;Ultrasound;Electrical Stimulation;Cryotherapy    PT Next Visit Plan  modalities for neuromuscular re education; therapeutic exercises; balance, hip rotary machine, leg press    PT Home Exercise Plan  hip adduction with glute sets, hip abduction with resistive band, SLR, ankle eversion with resistive band, toe flexion with ankle DF/toe extension with PF,        Patient will benefit from skilled therapeutic intervention in order to improve the following deficits and impairments:  Pain, Decreased activity tolerance, Decreased endurance, Decreased strength, Impaired perceived functional ability, Difficulty walking, Decreased balance  Visit Diagnosis: Muscle weakness (generalized)  Difficulty in walking, not elsewhere classified     Problem List There are no active problems to display for this patient.   Beacher May PT 03/07/2018, 12:08 PM  [place] [facility] PHYS[facility]TS MEDICINE [address] [place], [address] Phone: [phone]   Fax:  [phone]  Name: CUMI SANAGUSTIN MRN: [id number] Date of Birth: 1971/03/23

## 2018-03-13 ENCOUNTER — Ambulatory Visit: Payer: 59 | Admitting: Physical Therapy

## 2018-03-17 ENCOUNTER — Encounter: Payer: Self-pay | Admitting: Physical Therapy

## 2018-03-17 ENCOUNTER — Encounter: Payer: 59 | Admitting: Physical Therapy

## 2018-03-17 ENCOUNTER — Ambulatory Visit: Payer: 59 | Admitting: Physical Therapy

## 2018-03-17 DIAGNOSIS — M6281 Muscle weakness (generalized): Secondary | ICD-10-CM

## 2018-03-17 DIAGNOSIS — R262 Difficulty in walking, not elsewhere classified: Secondary | ICD-10-CM

## 2018-03-17 NOTE — Therapy (Signed)
[place] [facility]IC[facility] [address] [place], [address] Phone: [phone]   Fax:  [phone]  Physical Therapy Treatment  Patient Details  Name: Stephanie Pugh MRN: [id number] Date of Birth: [birth year] Referring Provider: Margarita Rana MD   Encounter Date: 03/17/2018  PT End of Session - 03/17/18 1909    Visit Number  6    Number of Visits  12    Date for PT Re-Evaluation  04/03/18    Authorization Type  6 of 6 FOTO  53/100    PT Start Time  1908    PT Stop Time  1940    PT Time Calculation (min)  32 min    Activity Tolerance  Patient tolerated treatment well    Behavior During Therapy  [facility] for tasks assessed/performed       Past Medical History:  Diagnosis Date  . Allergy   . Asthma     Past Surgical History:  Procedure Laterality Date  . ACHILLES TENDON LENGTHENING Left 05/26/2011    There were no vitals filed for this visit.  Subjective Assessment - 03/17/18 1911    Subjective  Patient reports right ankle feels increased tension behind heel and left ankle is about the same    Pertinent History  May 2012 underwent Achilles tendon lengthening; She had complications during healing with falling; she was placed back in boot and went back to work, had increased pain, tearing of tendon and siince then she has had difficulty with strength, endurance, walking, standing. She was seen by Dr. Eulah Pont recently and was told no surgery was indicated and she is now referred to out patient PT for strengthening.     Limitations  Standing;Walking;House hold activities;Other (comment) difficulty with going up on toes for any activity    Patient Stated Goals  to improve strength and function as much as possible     Currently in Pain?  No/denies            Objective: FOTO 53/100 (improved from 49/100) significant change for improved function Palpation: left Achilles tendon soft, thickened tissue; right non tender,  moderate spasm along calf   Treatment: Therapeutic exercise: patient performed with demonstration, verbal and tactile cues of therapist: goal: strength, improve function as indicated by FOTO, FADI, independent with home program   isometric PF prone lying each LE 5 reps  Leg press bilateral 55#  X 15, single 25# x 20 each   Hip rotary exercise 115# hip extension each LE  2 x 15 Hip abduction 70# each LE 1 x 15 reps   Core strengthening at OMEGA: Standing scapular rows with 15#  2 x 15 Standing straight arm pull downs 20# 2 x 15 reps Palloff press each side 5# x 15 reps Modified palloff facing cable with 15# x 15 reps    Patient response to treatment: patient required minimal cuing for improved technique with exercise.     PT Education - 03/17/18 1920    Education provided  Yes    Education Details  exercise instruction     Person(s) Educated  Patient    Methods  Explanation;Demonstration;Verbal cues    Comprehension  Verbal cues required;Returned demonstration;Verbalized understanding          PT Long Term Goals - 03/17/18 1933      PT LONG TERM GOAL #1   Title  Patient will demonstrate improved function with daily tasks as indicated by FOTO score of 50 and foot/ankle  disability index score of 25% or better    Baseline  FOTO 45; FADI 36%; FOTO 53    Status  Achieved      PT LONG TERM GOAL #2   Title  Patient will demonstrate improved function with daily tasks as indicated by FOTO score of 60 and foot/ankle disability index score of 20% or better    Baseline  FOTO 45; FADI 36%    Status  Revised    Target Date  04/03/18      PT LONG TERM GOAL #3   Title  Patient will be independent with home program for strength, balance, flexibility to transition to self management at discharge     Baseline  limited knowledge of appropriate exerises, progression and requires assistance, cuing    Status  On-going    Target Date  04/03/18            Plan - 03/17/18 1940     Clinical Impression Statement  Patient limited by symptoms in right posterior heel today therefore limited session with standing/ walking activities. Patient is improving with left LE strength with guided exercise and requires less assistance, cuing with exercises. her FOTO score is improving and she is progressing well towards goals.     Rehab Potential  Good    Clinical Impairments Affecting Rehab Potential  (+)motivated, age (-) chronic condition, multiple injuries and surgery left snkle >5 years ago    PT Frequency  2x / week    PT Duration  6 weeks    PT Treatment/Interventions  Manual techniques;Neuromuscular re-education;Patient/family education;Balance training;Therapeutic exercise;Moist Heat;Ultrasound;Electrical Stimulation;Cryotherapy    PT Next Visit Plan  modalities for neuromuscular re education; therapeutic exercises; balance, hip rotary machine, leg press    PT Home Exercise Plan  hip adduction with glute sets, hip abduction with resistive band, SLR, ankle eversion with resistive band, toe flexion with ankle DF/toe extension with PF,        Patient will benefit from skilled therapeutic intervention in order to improve the following deficits and impairments:  Pain, Decreased activity tolerance, Decreased endurance, Decreased strength, Impaired perceived functional ability, Difficulty walking, Decreased balance  Visit Diagnosis: Muscle weakness (generalized)  Difficulty in walking, not elsewhere classified     Problem List There are no active problems to display for this patient.   Beacher MayBrooks, Finnley Lewis PT 03/18/2018, 6:15 PM  [place] [facility] [address] [place], [place], [address] Phone: [phone]   Fax:  [phone]  Name: Stephanie Pugh MRN: [id number] Date of Birth: 1971/06/23

## 2018-03-20 ENCOUNTER — Ambulatory Visit: Payer: 59 | Admitting: Physical Therapy

## 2018-03-24 ENCOUNTER — Ambulatory Visit: Payer: 59 | Admitting: Physical Therapy

## 2018-03-24 ENCOUNTER — Encounter: Payer: 59 | Admitting: Physical Therapy

## 2018-03-27 ENCOUNTER — Ambulatory Visit: Payer: 59 | Admitting: Physical Therapy

## 2018-03-27 ENCOUNTER — Encounter: Payer: Self-pay | Admitting: Physical Therapy

## 2018-03-27 DIAGNOSIS — M6281 Muscle weakness (generalized): Secondary | ICD-10-CM

## 2018-03-27 DIAGNOSIS — R262 Difficulty in walking, not elsewhere classified: Secondary | ICD-10-CM

## 2018-03-27 NOTE — Therapy (Signed)
[place] [facility] PHYS[facility] AND [facility] MEDICINE [address] [place], [place], [address] Phone: [phone]   Fax:  [phone]  Physical Therapy Treatment  Patient Details  Name: Randon GoldsmithDarla D Kos MRN: [id number] Date of Birth: 09-02-1971 Referring Provider: Margarita RanaMurphy, Timothy MD   Encounter Date: 03/27/2018  PT End of Session - 03/27/18 1832    Visit Number  7    Number of Visits  12    Date for PT Re-Evaluation  04/03/18    Authorization Type  1 of 6 FOTO     PT Start Time  1825    PT Stop Time  1854    PT Time Calculation (min)  29 min    Activity Tolerance  Patient tolerated treatment well    Behavior During Therapy  [facility] for tasks assessed/performed       Past Medical History:  Diagnosis Date  . Allergy   . Asthma     Past Surgical History:  Procedure Laterality Date  . ACHILLES TENDON LENGTHENING Left 05/26/2011    There were no vitals filed for this visit.  Subjective Assessment - 03/27/18 1830    Subjective  Patient reports right ankle is doing better and is having weakness left LE     Pertinent History  May 2012 underwent Achilles tendon lengthening; She had complications during healing with falling; she was placed back in boot and went back to work, had increased pain, tearing of tendon and siince then she has had difficulty with strength, endurance, walking, standing. She was seen by Dr. Eulah PontMurphy recently and was told no surgery was indicated and she is now referred to out patient PT for strengthening.     Limitations  Standing;Walking;House hold activities;Other (comment) difficulty with going up on toes for any activity    Patient Stated Goals  to improve strength and function as much as possible     Currently in Pain?  No/denies              Objective: Palpation: left Achilles tendon soft, thickened tissue   Treatment: Therapeutic exercise: patient performed with demonstration, verbal and tactile cues of therapist: goal:  strength, improve function as indicated by FOTO, FADI, independent with home program    isometric PF prone lying left LE 10 rep, 5 second holds Isometric DF prone lying left ankle 10 reps,  Second holds with controlled eccentric  Leg press bilateral 45#  X 15, single 20# x 15 each monitoring PF forces, grinding   Hip rotary exercise 115# hip extension each LE  2 x 15 Hip abduction 70# each LE 1 x 15 reps   Core strengthening at OMEGA: Standing scapular rows with 15#  2 x 15 Standing straight arm pull downs 15# 2 x 15 reps Palloff press each side 5# x 15 reps Modified palloff facing cable with 15# x 15 reps    Patient response to treatment: patient required minimal cuing and demonstration for correct technique and reported moderate difficulty with PF exercise    PT Education - 03/27/18 1842    Education provided  Yes    Education Details  exercise instruction    Person(s) Educated  Patient    Methods  Explanation;Demonstration;Verbal cues    Comprehension  Verbal cues required;Returned demonstration;Verbalized understanding          PT Long Term Goals - 03/17/18 1933      PT LONG TERM GOAL #1   Title  Patient will demonstrate improved function with daily tasks  as indicated by FOTO score of 50 and foot/ankle disability index score of 25% or better    Baseline  FOTO 45; FADI 36%; FOTO 53    Status  Achieved      PT LONG TERM GOAL #2   Title  Patient will demonstrate improved function with daily tasks as indicated by FOTO score of 60 and foot/ankle disability index score of 20% or better    Baseline  FOTO 45; FADI 36%    Status  Revised    Target Date  04/03/18      PT LONG TERM GOAL #3   Title  Patient will be independent with home program for strength, balance, flexibility to transition to self management at discharge     Baseline  limited knowledge of appropriate exerises, progression and requires assistance, cuing    Status  On-going    Target Date  04/03/18             Plan - 03/27/18 1900    Clinical Impression Statement  Patient demonstrated improved strength and motor control left LE with exercises. She is progressing steadily with goals.     Rehab Potential  Good    Clinical Impairments Affecting Rehab Potential  (+)motivated, age (-) chronic condition, multiple injuries and surgery left snkle >5 years ago    PT Frequency  2x / week    PT Duration  6 weeks    PT Treatment/Interventions  Manual techniques;Neuromuscular re-education;Patient/family education;Balance training;Therapeutic exercise;Moist Heat;Ultrasound;Electrical Stimulation;Cryotherapy    PT Next Visit Plan  modalities for neuromuscular re education; therapeutic exercises; balance, hip rotary machine, leg press    PT Home Exercise Plan  hip adduction with glute sets, hip abduction with resistive band, SLR, ankle eversion with resistive band, toe flexion with ankle DF/toe extension with PF,        Patient will benefit from skilled therapeutic intervention in order to improve the following deficits and impairments:  Pain, Decreased activity tolerance, Decreased endurance, Decreased strength, Impaired perceived functional ability, Difficulty walking, Decreased balance  Visit Diagnosis: Muscle weakness (generalized)  Difficulty in walking, not elsewhere classified     Problem List There are no active problems to display for this patient.   Beacher May PT 03/28/2018, 3:09 PM  [place] [facility] PHYSICAL AND SPORTS MEDICINE [address] [place], [address] Phone: [phone]   Fax:  [phone]  Name: SAVANNA DOOLEY MRN: [id number] Date of Birth: 01-26-1971

## 2018-03-31 ENCOUNTER — Ambulatory Visit: Payer: 59 | Attending: Orthopedic Surgery | Admitting: Physical Therapy

## 2018-03-31 ENCOUNTER — Encounter: Payer: Self-pay | Admitting: Physical Therapy

## 2018-03-31 DIAGNOSIS — M6281 Muscle weakness (generalized): Secondary | ICD-10-CM | POA: Insufficient documentation

## 2018-03-31 DIAGNOSIS — R262 Difficulty in walking, not elsewhere classified: Secondary | ICD-10-CM | POA: Diagnosis present

## 2018-03-31 NOTE — Therapy (Signed)
[facility] [address] [place], [address] Phone: [phone]   Fax:  [phone]  Physical Therapy Treatment  Patient Details  Name: Stephanie Pugh MRN: [id number] Date of Birth: 08/11/1971 Referring Provider: Margarita Rana MD   Encounter Date: 03/31/2018  PT End of Session - 03/31/18 1943    Visit Number  8    Number of Visits  12    Date for PT Re-Evaluation  04/03/18    Authorization Type  2 of 6 FOTO     PT Start Time  1900    PT Stop Time  1943    PT Time Calculation (min)  43 min    Activity Tolerance  Patient tolerated treatment well    Behavior During Therapy  [facility] for tasks assessed/performed       Past Medical History:  Diagnosis Date  . Allergy   . Asthma     Past Surgical History:  Procedure Laterality Date  . ACHILLES TENDON LENGTHENING Left 05/26/2011    There were no vitals filed for this visit.  Subjective Assessment - 03/31/18 1916    Subjective  Patient reports overall feeling stronger and is still weak in left ankle.    Pertinent History  May 2012 underwent Achilles tendon lengthening; She had complications during healing with falling; she was placed back in boot and went back to work, had increased pain, tearing of tendon and siince then she has had difficulty with strength, endurance, walking, standing. She was seen by Dr. Eulah Pont recently and was told no surgery was indicated and she is now referred to out patient PT for strengthening.     Limitations  Standing;Walking;House hold activities;Other (comment) difficulty with going up on toes for any activity    Patient Stated Goals  to improve strength and function as much as possible     Currently in Pain?  No/denies         Objective: Palpation: left Achilles tendon soft, thickened tissue   Treatment: Therapeutic exercise: patient performed with demonstration, verbal and tactile cues of therapist: goal: strength, improve  function as indicated by FOTO, FADI, independent with home program    isometric PF prone lying left LE 10 rep, 5 second holds Isometric DF prone lying left ankle 10 reps,  Second holds with controlled eccentric  Leg press bilateral 45#  X 15, single 20# x 15 each monitoring PF forces, grinding   Hip rotary exercise 115# hip extension each LE  2 x 15 Hip abduction 70# left LE 1 x 15 reps   Core strengthening at OMEGA: Standing scapular rows with 15#  2 x 15 Standing straight arm pull downs 15# 2 x 15 reps Palloff press each side 5# x 15 reps Modified palloff facing cable with 15# x 15 reps   Patient response to treatment: patient demonstrated improved technique with exercises with minimal VC for correct alignment.      PT Education - 03/31/18 1930    Education provided  Yes    Education Details  exercise instruction    Person(s) Educated  Patient    Methods  Explanation;Demonstration;Verbal cues    Comprehension  Verbal cues required;Returned demonstration;Verbalized understanding          PT Long Term Goals - 03/17/18 1933      PT LONG TERM GOAL #1   Title  Patient will demonstrate improved function with daily tasks as indicated by FOTO score of 50 and foot/ankle disability  index score of 25% or better    Baseline  FOTO 45; FADI 36%; FOTO 53    Status  Achieved      PT LONG TERM GOAL #2   Title  Patient will demonstrate improved function with daily tasks as indicated by FOTO score of 60 and foot/ankle disability index score of 20% or better    Baseline  FOTO 45; FADI 36%    Status  Revised    Target Date  04/03/18      PT LONG TERM GOAL #3   Title  Patient will be independent with home program for strength, balance, flexibility to transition to self management at discharge     Baseline  limited knowledge of appropriate exerises, progression and requires assistance, cuing    Status  On-going    Target Date  04/03/18            Plan - 03/31/18 2020    Clinical  Impression Statement  Patient is progressing with goals with improving endurance and strength with exercises with minimal ucing of therapist. She continues with primary weakness in left ankle plantarflexion due to Achillles tendon injuries.     Rehab Potential  Good    Clinical Impairments Affecting Rehab Potential  (+)motivated, age (-) chronic condition, multiple injuries and surgery left snkle >5 years ago    PT Frequency  2x / week    PT Duration  6 weeks    PT Treatment/Interventions  Manual techniques;Neuromuscular re-education;Patient/family education;Balance training;Therapeutic exercise;Moist Heat;Ultrasound;Electrical Stimulation;Cryotherapy    PT Next Visit Plan  modalities for neuromuscular re education; therapeutic exercises; balance, hip rotary machine, leg press    PT Home Exercise Plan  hip adduction with glute sets, hip abduction with resistive band, SLR, ankle eversion with resistive band, toe flexion with ankle DF/toe extension with PF,        Patient will benefit from skilled therapeutic intervention in order to improve the following deficits and impairments:  Pain, Decreased activity tolerance, Decreased endurance, Decreased strength, Impaired perceived functional ability, Difficulty walking, Decreased balance  Visit Diagnosis: Muscle weakness (generalized)  Difficulty in walking, not elsewhere classified     Problem List There are no active problems to display for this patient.   Beacher MayBrooks, Marie PT 04/01/2018, 10:27 PM  [place] [facility] [address] [place], [place], [address] Phone: [phone]   Fax:  [phone]  Name: Stephanie GoldsmithDarla D Pugh MRN: [id number] Date of Birth: 09-23-1971

## 2018-04-03 ENCOUNTER — Encounter: Payer: Self-pay | Admitting: Physical Therapy

## 2018-04-03 ENCOUNTER — Ambulatory Visit: Payer: 59 | Admitting: Physical Therapy

## 2018-04-03 DIAGNOSIS — R262 Difficulty in walking, not elsewhere classified: Secondary | ICD-10-CM

## 2018-04-03 DIAGNOSIS — M6281 Muscle weakness (generalized): Secondary | ICD-10-CM | POA: Diagnosis not present

## 2018-04-03 NOTE — Therapy (Signed)
[place] [facility] [address] [place], [address] Phone: [phone]   Fax:  [phone]  Physical Therapy Treatment  Patient Details  Name: Stephanie Pugh MRN: [id number] Date of Birth: Jan 05, 1971 Referring Provider: Margarita Rana MD   Encounter Date: 04/03/2018  PT End of Session - 04/03/18 1726    Visit Number  9    Number of Visits  12    Date for PT Re-Evaluation  04/03/18    Authorization Type  3 of 6 FOTO     PT Start Time  1722    PT Stop Time  1800    PT Time Calculation (min)  38 min    Activity Tolerance  Patient tolerated treatment well    Behavior During Therapy  [facility] for tasks assessed/performed       Past Medical History:  Diagnosis Date  . Allergy   . Asthma     Past Surgical History:  Procedure Laterality Date  . ACHILLES TENDON LENGTHENING Left 05/26/2011    There were no vitals filed for this visit.  Subjective Assessment - 04/03/18 1720    Subjective  Patient continues with weakenss in left ankle and is feeling stronger with exercises.     Pertinent History  May 2012 underwent Achilles tendon lengthening; She had complications during healing with falling; she was placed back in boot and went back to work, had increased pain, tearing of tendon and siince then she has had difficulty with strength, endurance, walking, standing. She was seen by Dr. Eulah Pont recently and was told no surgery was indicated and she is now referred to out patient PT for strengthening.     Limitations  Standing;Walking;House hold activities;Other (comment) difficulty with going up on toes for any activity    Patient Stated Goals  to improve strength and function as much as possible     Currently in Pain?  No/denies       Objective: Palpation: left Achilles tendon soft, thickened tissue   Treatment: Therapeutic exercise: patient performed with demonstration, verbal and tactile cues of therapist: goal:  strength, improve function as indicated by FOTO, FADI, independent with home program    isometric PF supine lying left LE 10 rep, 5 second holds Isometric DF and eversion supine lying left ankle 10 reps,  5 Second holds with controlled eccentric  Leg press bilateral 45#  X 15, single 20# x 15 each monitoring PF forces, grinding   Hip rotary exercise 115# hip extension each LE  2 x 15 Hip abduction 70# each LE 1 x 15 reps   Core strengthening at OMEGA: Standing scapular rows with 15#  2 x 15 Standing straight arm pull downs with patient standing on (2) balance stones 15# 2 x 15 reps Palloff press each side 5# x 15 reps Modified palloff facing cable standing on (2) balance stones 15# x 15 reps  Stabilization for ankle with one foot on balance stone 5# pull to side of hip x 15 each side, each LE standing   Patient response to treatment: Patient improved balance with exercises on balance stones with repetition and following demonstration. Patient demonstrates improved strength with ankle eversion, tolerating increased resistance with ankle eversion, DF    PT Education - 04/03/18 1800    Education provided  Yes    Education Details  exercise instruction    Person(s) Educated  Patient    Methods  Explanation;Demonstration;Verbal cues    Comprehension  Verbalized understanding;Returned demonstration;Verbal  cues required          PT Long Term Goals - 03/17/18 1933      PT LONG TERM GOAL #1   Title  Patient will demonstrate improved function with daily tasks as indicated by FOTO score of 50 and foot/ankle disability index score of 25% or better    Baseline  FOTO 45; FADI 36%; FOTO 53    Status  Achieved      PT LONG TERM GOAL #2   Title  Patient will demonstrate improved function with daily tasks as indicated by FOTO score of 60 and foot/ankle disability index score of 20% or better    Baseline  FOTO 45; FADI 36%    Status  Revised    Target Date  04/03/18      PT LONG TERM GOAL  #3   Title  Patient will be independent with home program for strength, balance, flexibility to transition to self management at discharge     Baseline  limited knowledge of appropriate exerises, progression and requires assistance, cuing    Status  On-going    Target Date  04/03/18            Plan - 04/03/18 1800    Clinical Impression Statement  Patient is progressing with goals with improving strength and progressive exercises with moderate cuing for proper technique.     Rehab Potential  Good    Clinical Impairments Affecting Rehab Potential  (+)motivated, age (-) chronic condition, multiple injuries and surgery left snkle >5 years ago    PT Frequency  2x / week    PT Duration  6 weeks    PT Treatment/Interventions  Manual techniques;Neuromuscular re-education;Patient/family education;Balance training;Therapeutic exercise;Moist Heat;Ultrasound;Electrical Stimulation;Cryotherapy    PT Next Visit Plan  modalities for neuromuscular re education; therapeutic exercises; balance, hip rotary machine, leg press    PT Home Exercise Plan  hip adduction with glute sets, hip abduction with resistive band, SLR, ankle eversion with resistive band, toe flexion with ankle DF/toe extension with PF,        Patient will benefit from skilled therapeutic intervention in order to improve the following deficits and impairments:  Pain, Decreased activity tolerance, Decreased endurance, Decreased strength, Impaired perceived functional ability, Difficulty walking, Decreased balance  Visit Diagnosis: Muscle weakness (generalized)  Difficulty in walking, not elsewhere classified     Problem List There are no active problems to display for this patient.   Beacher MayBrooks, Jolly Carlini PT 04/04/2018, 1:19 PM  [place] [facility] [address] [place], [place], [address] Phone: [phone]   Fax:  [phone]  Name: Stephanie GoldsmithDarla D Pugh MRN: [id number] Date of  Birth: 1971/09/29

## 2018-04-07 ENCOUNTER — Encounter: Payer: Self-pay | Admitting: Physical Therapy

## 2018-04-07 ENCOUNTER — Ambulatory Visit: Payer: 59 | Admitting: Physical Therapy

## 2018-04-07 DIAGNOSIS — M6281 Muscle weakness (generalized): Secondary | ICD-10-CM

## 2018-04-07 DIAGNOSIS — R262 Difficulty in walking, not elsewhere classified: Secondary | ICD-10-CM

## 2018-04-07 NOTE — Therapy (Addendum)
[facility] [facility] [address] [place], [place], [address] Phone: [phone]   Fax:  [phone]  Physical Therapy Treatment  Patient Details  Name: Stephanie GoldsmithDarla D Pugh MRN: [id number] Date of Birth: Nov 18, 1971 Referring Provider: Margarita RanaMurphy, Timothy MD   Encounter Date: 04/07/2018  PT End of Session - 04/07/18 1902    Visit Number  10    Number of Visits  12    Date for PT Re-Evaluation  05/19/18    Authorization Type  4 of 6 FOTO     PT Start Time  1858    PT Stop Time  1938    PT Time Calculation (min)  40 min    Activity Tolerance  Patient tolerated treatment well    Behavior During Therapy  [facility] for tasks assessed/performed       Past Medical History:  Diagnosis Date  . Allergy   . Asthma     Past Surgical History:  Procedure Laterality Date  . ACHILLES TENDON LENGTHENING Left 05/26/2011    There were no vitals filed for this visit.  Subjective Assessment - 04/07/18 1901    Subjective  Patient reports she is fatigued from working all day    Pertinent History  May 2012 underwent Achilles tendon lengthening; She had complications during healing with falling; she was placed back in boot and went back to work, had increased pain, tearing of tendon and siince then she has had difficulty with strength, endurance, walking, standing. She was seen by Dr. Eulah PontMurphy recently and was told no surgery was indicated and she is now referred to out patient PT for strengthening.     Limitations  Standing;Walking;House hold activities;Other (comment) difficulty with going up on toes for any activity    Patient Stated Goals  to improve strength and function as much as possible     Currently in Pain?  Yes    Pain Score  3     Pain Location  Leg    Pain Orientation  Left    Pain Descriptors / Indicators  Aching;Spasm    Pain Type  Chronic pain;Acute pain    Pain Onset  More than a month ago    Pain Frequency  Intermittent            Objective: Gait: antalgic, decrease push off left   Treatment: Therapeutic exercise: patient performed with demonstration, verbal and tactile cues of therapist: goal: strength, improve function as indicated by FOTO, FADI, independent with home program    isometric PF supine lying left LE 10 rep, 5 second holds Isometric DF and eversion supine lying left ankle 10 reps,  5 Second holds with controlled eccentric  Leg press bilateral 55#  X 15, single 20# x 15 each monitoring PF forces, grinding   Hip rotary exercise 115# hip extension each LE  2 x 15 Hip abduction 70# each LE 1 x 15 reps   Core strengthening at OMEGA: Standing scapular rows with 15#  2 x 15 Standing straight arm pull downs with patient standing on (2) balance stones 15# 2 x 15 reps Palloff press each side 5# x 15 reps Modified palloff facing cable standing on (2) balance stones 15# x 15 reps  Stabilization for ankle with one foot on balance stone 5# pull to side of hip x 15 each side,left LE standing   Patient response to treatment: Patient demonstrated improved technique and ability to perform exercises with minimal verbal cuing  PT Long Term Goals - 04/07/18 2126      PT LONG TERM GOAL #1   Title  Patient will demonstrate improved function with daily tasks as indicated by FOTO score of 50 and foot/ankle disability index score of 25% or better    Baseline  FOTO 45; FADI 36%; FOTO 53    Status  Achieved      PT LONG TERM GOAL #2   Title  Patient will demonstrate improved function with daily tasks as indicated by FOTO score of 60 and foot/ankle disability index score of 20% or better    Baseline  FOTO 45; FADI 36%    Status  Revised    Target Date  05/19/18      PT LONG TERM GOAL #3   Title  Patient will be independent with home program for strength, balance, flexibility to transition to self management at discharge     Baseline  limited knowledge of appropriate exerises, progression and requires  assistance, cuing    Status  On-going    Target Date  05/19/18            Plan - 04/07/18 1943    Clinical Impression Statement  Patient is progressing with goals with improving strength and progressive exercises with minimal cuing  and demonstration for technique. She will benefit from continued physical therapy intervention to achieve goals.     Rehab Potential  Good    Clinical Impairments Affecting Rehab Potential  (+)motivated, age (-) chronic condition, multiple injuries and surgery left snkle >5 years ago    PT Frequency  2x / week    PT Duration  6 weeks    PT Treatment/Interventions  Manual techniques;Neuromuscular re-education;Patient/family education;Balance training;Therapeutic exercise;Moist Heat;Ultrasound;Electrical Stimulation;Cryotherapy    PT Next Visit Plan  modalities for neuromuscular re education; therapeutic exercises; balance, hip rotary machine, leg press    PT Home Exercise Plan  hip adduction with glute sets, hip abduction with resistive band, SLR, ankle eversion with resistive band, toe flexion with ankle DF/toe extension with PF,        Patient will benefit from skilled therapeutic intervention in order to improve the following deficits and impairments:  Pain, Decreased activity tolerance, Decreased endurance, Decreased strength, Impaired perceived functional ability, Difficulty walking, Decreased balance  Visit Diagnosis: Muscle weakness (generalized) - Plan: PT plan of care cert/re-cert  Difficulty in walking, not elsewhere classified - Plan: PT plan of care cert/re-cert     Problem List There are no active problems to display for this patient.   Beacher May PT 04/08/2018, 6:16 PM  [place] [facility] PH[facility]TS MEDICINE [address] [place], [address] Phone: [phone]   Fax:  [phone]  Name: Stephanie Pugh MRN: [id number] Date of Birth: 26-Nov-1971

## 2018-04-10 ENCOUNTER — Ambulatory Visit: Payer: 59 | Admitting: Physical Therapy

## 2018-04-10 ENCOUNTER — Encounter: Payer: Self-pay | Admitting: Physical Therapy

## 2018-04-10 DIAGNOSIS — R262 Difficulty in walking, not elsewhere classified: Secondary | ICD-10-CM

## 2018-04-10 DIAGNOSIS — M6281 Muscle weakness (generalized): Secondary | ICD-10-CM

## 2018-04-10 NOTE — Therapy (Signed)
[place] [facility] [address], [place], [address] Phone: [phone]   Fax:  [phone]  Physical Therapy Treatment  Patient Details  Name: Stephanie Pugh MRN: [id number] Date of Birth: September 29, 1971 Referring Provider: Margarita Rana MD   Encounter Date: 04/10/2018  PT End of Session - 04/10/18 1953    Visit Number  11    Number of Visits  24    Date for PT Re-Evaluation  05/19/18    Authorization Type  5 of 6 FOTO     PT Start Time  1815    PT Stop Time  1848    PT Time Calculation (min)  33 min    Activity Tolerance  Patient tolerated treatment well    Behavior During Therapy  [facility] for tasks assessed/performed       Past Medical History:  Diagnosis Date  . Allergy   . Asthma     Past Surgical History:  Procedure Laterality Date  . ACHILLES TENDON LENGTHENING Left 05/26/2011    There were no vitals filed for this visit.  Subjective Assessment - 04/10/18 1820    Subjective  Patient reports she a little sore in her back today due to working in the house.     Pertinent History  May 2012 underwent Achilles tendon lengthening; She had complications during healing with falling; she was placed back in boot and went back to work, had increased pain, tearing of tendon and siince then she has had difficulty with strength, endurance, walking, standing. She was seen by Dr. Eulah Pont recently and was told no surgery was indicated and she is now referred to out patient PT for strengthening.     Limitations  Standing;Walking;House hold activities;Other (comment);Sitting difficulty with going up on toes for any activity    Patient Stated Goals  to improve strength and function as much as possible     Currently in Pain?  No/denies       Objective: Gait: antalgic, decrease push off left   Treatment: Therapeutic exercise: patient performed with demonstration, verbal and tactile cues of therapist: goal: strength, improve  function as indicated by FOTO, FADI, independent with home program    Leg press bilateral 55#  X 15, single 20# x 15 each monitoring PF forces, grinding   Hip rotary exercise 115# hip extension each LE  1 x 15; balance stone x 15 Hip abduction 70# each LE 1 x 15 reps   Core strengthening at OMEGA: Standing scapular rows with 15#  2 x 15 Standing straight arm pull downs with patient standing on (2) balance stones 15# 2 x 15 reps Palloff press each side 5# x 15 reps Modified palloff facing cable standing on (2) balance stones 15# x 15 reps  Stabilization for ankle with one foot on balance stone 5# pull to side of hip x 15 each side,left LE standing   Patient response to treatment: Patient demonstrated improved technique and ability to perform exercises with minimal verbal cuing     PT Education - 04/10/18 1958    Education provided  Yes    Education Details  exercise instruction    Person(s) Educated  Patient    Methods  Explanation;Demonstration;Verbal cues    Comprehension  Verbalized understanding;Returned demonstration;Verbal cues required          PT Long Term Goals - 03/17/18 1933      PT LONG TERM GOAL #1   Title  Patient will demonstrate improved function  with daily tasks as indicated by FOTO score of 50 and foot/ankle disability index score of 25% or better    Baseline  FOTO 45; FADI 36%; FOTO 53    Status  Achieved      PT LONG TERM GOAL #2   Title  Patient will demonstrate improved function with daily tasks as indicated by FOTO score of 60 and foot/ankle disability index score of 20% or better    Baseline  FOTO 45; FADI 36%    Status  Revised    Target Date  04/03/18      PT LONG TERM GOAL #3   Title  Patient will be independent with home program for strength, balance, flexibility to transition to self management at discharge     Baseline  limited knowledge of appropriate exerises, progression and requires assistance, cuing    Status  On-going    Target Date   04/03/18            Plan - 04/10/18 1900    Clinical Impression Statement  Patient is progressing with goals with improvement noted in general strength left LE hip, knee and ankle. She continues with significant weakness left ankle PF due ot multiple injuries, Achilles tendon tears and will benefit from continues physical therapy interventoin to allow her to transition to independent homs program foe continued progression.     Rehab Potential  Good    Clinical Impairments Affecting Rehab Potential  (+)motivated, age (-) chronic condition, multiple injuries and surgery left snkle >5 years ago    PT Frequency  2x / week    PT Duration  6 weeks    PT Treatment/Interventions  Manual techniques;Neuromuscular re-education;Patient/family education;Balance training;Therapeutic exercise;Moist Heat;Ultrasound;Electrical Stimulation;Cryotherapy    PT Next Visit Plan  modalities for neuromuscular re education; therapeutic exercises; balance, hip rotary machine, leg press    PT Home Exercise Plan  hip adduction with glute sets, hip abduction with resistive band, SLR, ankle eversion with resistive band, toe flexion with ankle DF/toe extension with PF,        Patient will benefit from skilled therapeutic intervention in order to improve the following deficits and impairments:  Pain, Decreased activity tolerance, Decreased endurance, Decreased strength, Impaired perceived functional ability, Difficulty walking, Decreased balance  Visit Diagnosis: Muscle weakness (generalized)  Difficulty in walking, not elsewhere classified     Problem List There are no active problems to display for this patient.   Beacher MayBrooks, Tamsin Nader PT 04/11/2018, 12:09 PM  [place] [facility] [address] [place], [place], [address] Phone: [phone]   Fax:  [phone]  Name: Stephanie GoldsmithDarla D Pugh MRN: [id number] Date of Birth: 03-Sep-1971

## 2018-04-14 ENCOUNTER — Ambulatory Visit: Payer: 59 | Admitting: Physical Therapy

## 2018-04-14 DIAGNOSIS — M6281 Muscle weakness (generalized): Secondary | ICD-10-CM | POA: Diagnosis not present

## 2018-04-14 DIAGNOSIS — R262 Difficulty in walking, not elsewhere classified: Secondary | ICD-10-CM

## 2018-04-14 NOTE — Therapy (Signed)
[place] [facility] [address] [place], [place], [address] Phone: [phone]   Fax:  [phone]  Physical Therapy Treatment  Patient Details  Name: Stephanie GoldsmithDarla D Pugh MRN: [id number] Date of Birth: 1971/11/07 Referring Provider: Margarita RanaMurphy, Timothy MD   Encounter Date: 04/14/2018  PT End of Session - 04/14/18 1842    Visit Number  12    Number of Visits  24    Date for PT Re-Evaluation  05/19/18    Authorization Type  6 of 6 FOTO     PT Start Time  1845    PT Stop Time  1920    PT Time Calculation (min)  35 min    Activity Tolerance  Patient tolerated treatment well    Behavior During Therapy  [facility] for tasks assessed/performed       Past Medical History:  Diagnosis Date  . Allergy   . Asthma     Past Surgical History:  Procedure Laterality Date  . ACHILLES TENDON LENGTHENING Left 05/26/2011    There were no vitals filed for this visit.  Subjective Assessment - 04/14/18 1845    Subjective  Patient rpeots pain in lower back left side and left calf muscle. patient reports pain in left ankle and LE and back at end of work day.    Pertinent History  May 2012 underwent Achilles tendon lengthening; She had complications during healing with falling; she was placed back in boot and went back to work, had increased pain, tearing of tendon and siince then she has had difficulty with strength, endurance, walking, standing. She was seen by Dr. Eulah PontMurphy recently and was told no surgery was indicated and she is now referred to out patient PT for strengthening.     Limitations  Standing;Walking;House hold activities;Other (comment);Sitting difficulty with going up on toes for any activity    Patient Stated Goals  to improve strength and function as much as possible     Currently in Pain?  Yes    Pain Score  3     Pain Location  Back    Pain Orientation  Left    Pain Descriptors / Indicators  Aching;Spasm    Pain Type  Chronic pain;Acute  pain    Pain Onset  More than a month ago    Pain Frequency  Intermittent       Objective: Gait: antalgic, decrease push off left Strength: decreased left ankle PF 3-/5 FOTO: 44/100 (decreased due to chronic condition and patient's day to day assessment of function) FADI: 30% (was 36% demonstrating minimal improvement due to chronic condition of Achilles tendon tears)   Treatment: Therapeutic exercise: patient performed with demonstration, verbal and tactile cues of therapist: goal: strength, improve function as indicated by FOTO, FADI, independent with home program    Leg press bilateral 45#  X 15, single 20# x 15 each monitoring PF forces, grinding   Hip rotary exercise 115# hip extension each LE  1 x 15 Hip abduction 55# each LE 1 x 15 reps   Core strengthening at OMEGA: Standing scapular rows with 15#  x 15 Standing straight arm pull downs  15#  x 15 reps Palloff press each side 5# x 15 reps Modified palloff facing cable 10# x 15 reps    Manual therapy: 10 min: goal: pain, spasms STM performed to lumbar spine and left calf muscle with patient prone lying: superficial and deep techniques  Patient response to treatment: limited session due to  fatigue and back pain. patient able to complete exercises with minimal cuing. Improved soft issue elasticity with decreased spasms by 50% following STM       PT Education - 04/14/18 1900    Education provided  Yes    Education Details  exercise instruction    Person(s) Educated  Patient    Methods  Explanation;Demonstration;Verbal cues    Comprehension  Returned demonstration;Verbal cues required;Verbalized understanding          PT Long Term Goals - 04/07/18 2126      PT LONG TERM GOAL #1   Title  Patient will demonstrate improved function with daily tasks as indicated by FOTO score of 50 and foot/ankle disability index score of 25% or better    Baseline  FOTO 45; FADI 36%; FOTO 53    Status  Achieved      PT LONG TERM  GOAL #2   Title  Patient will demonstrate improved function with daily tasks as indicated by FOTO score of 60 and foot/ankle disability index score of 20% or better    Baseline  FOTO 45; FADI 36%    Status  Revised    Target Date  05/19/18      PT LONG TERM GOAL #3   Title  Patient will be independent with home program for strength, balance, flexibility to transition to self management at discharge     Baseline  limited knowledge of appropriate exerises, progression and requires assistance, cuing    Status  On-going    Target Date  05/19/18            Plan - 04/14/18 1846    Clinical Impression Statement  Patient is progressing with goals with improvement noted in general strength left LE hip, knee and ankle. She conitnues with significant weakness left ankle PF due to muliple injuries, Achilles tendon tears. she will benefit from continued phyiscal therapy intervention to allow her to treasition to indpendent home program for continued progression.     Rehab Potential  Good    Clinical Impairments Affecting Rehab Potential  (+)motivated, age (-) chronic condition, multiple injuries and surgery left snkle >5 years ago    PT Frequency  2x / week    PT Duration  6 weeks    PT Treatment/Interventions  Manual techniques;Neuromuscular re-education;Patient/family education;Balance training;Therapeutic exercise;Moist Heat;Ultrasound;Electrical Stimulation;Cryotherapy    PT Next Visit Plan  modalities for neuromuscular re education; therapeutic exercises; balance, hip rotary machine, leg press    PT Home Exercise Plan  hip adduction with glute sets, hip abduction with resistive band, SLR, ankle eversion with resistive band, toe flexion with ankle DF/toe extension with PF,        Patient will benefit from skilled therapeutic intervention in order to improve the following deficits and impairments:  Pain, Decreased activity tolerance, Decreased endurance, Decreased strength, Impaired perceived  functional ability, Difficulty walking, Decreased balance  Visit Diagnosis: Muscle weakness (generalized)  Difficulty in walking, not elsewhere classified     Problem List There are no active problems to display for this patient.   Beacher May PT 04/15/2018, 9:53 PM  [facility] PHYSICAL [facility] SPORTS MEDICINE [address], [place], [address] Phone: [phone]   Fax:  [phone]  Name: Stephanie Pugh MRN: [id number] Date of Birth: 11/29/1971

## 2018-04-15 NOTE — Addendum Note (Signed)
Addended by: Carl BestBROOKS, Karthika Glasper L on: 04/15/2018 09:34 PM   Modules accepted: Orders

## 2018-06-23 DIAGNOSIS — M7662 Achilles tendinitis, left leg: Secondary | ICD-10-CM | POA: Insufficient documentation

## 2019-08-14 ENCOUNTER — Other Ambulatory Visit: Payer: Self-pay

## 2019-08-14 DIAGNOSIS — Z20822 Contact with and (suspected) exposure to covid-19: Secondary | ICD-10-CM

## 2019-08-15 LAB — NOVEL CORONAVIRUS, NAA: SARS-CoV-2, NAA: NOT DETECTED

## 2019-08-18 ENCOUNTER — Telehealth: Payer: Self-pay | Admitting: *Deleted

## 2019-08-18 NOTE — Telephone Encounter (Signed)
No answer and  No mailbox to leave callback message.[phone] test is negative.

## 2020-01-28 ENCOUNTER — Ambulatory Visit: Payer: 59 | Attending: Internal Medicine

## 2020-01-28 DIAGNOSIS — Z20822 Contact with and (suspected) exposure to covid-19: Secondary | ICD-10-CM

## 2020-01-29 LAB — NOVEL CORONAVIRUS, NAA: SARS-CoV-2, NAA: NOT DETECTED

## 2020-07-15 ENCOUNTER — Telehealth: Payer: Self-pay | Admitting: Hematology and Oncology

## 2020-07-15 NOTE — Telephone Encounter (Signed)
Received a new hem referral from Dr. Thomasena Edis for elevated wbc. Pt has been cld and scheduled to see Dr. Leonides Schanz on 8/5 at 2pm. Pt aware to arrive 15 minutes early.

## 2020-08-04 ENCOUNTER — Inpatient Hospital Stay: Payer: 59 | Admitting: Hematology and Oncology

## 2020-08-04 ENCOUNTER — Telehealth: Payer: Self-pay | Admitting: Hematology and Oncology

## 2020-08-04 ENCOUNTER — Inpatient Hospital Stay: Payer: 59

## 2020-08-04 NOTE — Telephone Encounter (Signed)
Pt cld to reschedule her new hem appt w/Dr. Leonides Schanz to 9/16 at 2pm. Pt aware to arrive 15 minutes early.

## 2020-08-04 NOTE — Progress Notes (Deleted)
[facility] [phone]   [phone]  INITIAL CONSULT NOTE  Patient Care Team: Patient, No Pcp Per as PCP - General (General Practice)  Hematological/Oncological History # Leukocytosis 1) 07/06/2020: WBC 14.1, Hgb 14.4, Plt 291, MCV 88.4 2) 08/04/2020: establish care with Dr. Leonides Schanz    CHIEF COMPLAINTS/PURPOSE OF CONSULTATION:  "*** "  HISTORY OF PRESENTING ILLNESS:  Stephanie Pugh 49 y.o. female with medical history significant for ***  On review of the previous records ***  On exam today ***  MEDICAL HISTORY:  Past Medical History:  Diagnosis Date  . Allergy   . Asthma     SURGICAL HISTORY: Past Surgical History:  Procedure Laterality Date  . ACHILLES TENDON LENGTHENING Left 05/26/2011    SOCIAL HISTORY: Social History   Socioeconomic History  . Marital status: Single    Spouse name: Not on file  . Number of children: Not on file  . Years of education: Not on file  . Highest education level: Not on file  Occupational History  . Not on file  Tobacco Use  . Smoking status: Current Every Day Smoker  . Smokeless tobacco: Never Used  Vaping Use  . Vaping Use: Never used  Substance and Sexual Activity  . Alcohol use: No  . Drug use: Not on file  . Sexual activity: Not on file  Other Topics Concern  . Not on file  Social History Narrative  . Not on file   Social Determinants of Health   Financial Resource Strain:   . Difficulty of Paying Living Expenses:   Food Insecurity:   . Worried About Programme researcher, broadcasting/film/video in the Last Year:   . Barista in the Last Year:   Transportation Needs:   . Freight forwarder (Medical):   Marland Kitchen Lack of Transportation (Non-Medical):   Physical Activity:   . Days of Exercise per Week:   . Minutes of Exercise per Session:   Stress:   . Feeling of Stress :   Social Connections:   . Frequency of Communication with Friends and Family:   . Frequency of Social Gatherings with Friends  and Family:   . Attends Religious Services:   . Active Member of Clubs or Organizations:   . Attends Banker Meetings:   Marland Kitchen Marital Status:   Intimate Partner Violence:   . Fear of Current or Ex-Partner:   . Emotionally Abused:   Marland Kitchen Physically Abused:   . Sexually Abused:     FAMILY HISTORY: No family history on file.  ALLERGIES:  is allergic to ampicillin.  MEDICATIONS:  No current outpatient medications on file.   No current facility-administered medications for this visit.    REVIEW OF SYSTEMS:   Constitutional: ( - ) fevers, ( - )  chills , ( - ) night sweats Eyes: ( - ) blurriness of vision, ( - ) double vision, ( - ) watery eyes Ears, nose, mouth, throat, and face: ( - ) mucositis, ( - ) sore throat Respiratory: ( - ) cough, ( - ) dyspnea, ( - ) wheezes Cardiovascular: ( - ) palpitation, ( - ) chest discomfort, ( - ) lower extremity swelling Gastrointestinal:  ( - ) nausea, ( - ) heartburn, ( - ) change in bowel habits Skin: ( - ) abnormal skin rashes Lymphatics: ( - ) new lymphadenopathy, ( - ) easy bruising Neurological: ( - ) numbness, ( - ) tingling, ( - ) new weaknesses Behavioral/Psych: ( - )  mood change, ( - ) new changes  All other systems were reviewed with the patient and are negative.  PHYSICAL EXAMINATION: ECOG PERFORMANCE STATUS: {CHL ONC ECOG PS:[id number]}  There were no vitals filed for this visit. There were no vitals filed for this visit.  GENERAL: well appearing *** in NAD  SKIN: skin color, texture, turgor are normal, no rashes or significant lesions EYES: conjunctiva are pink and non-injected, sclera clear OROPHARYNX: no exudate, no erythema; lips, buccal mucosa, and tongue normal  NECK: supple, non-tender LYMPH:  no palpable lymphadenopathy in the cervical, axillary or inguinal LUNGS: clear to auscultation and percussion with normal breathing effort HEART: regular rate & rhythm and no murmurs and no lower extremity  edema ABDOMEN: soft, non-tender, non-distended, normal bowel sounds Musculoskeletal: no cyanosis of digits and no clubbing  PSYCH: alert & oriented x 3, fluent speech NEURO: no focal motor/sensory deficits  LABORATORY DATA:  I have reviewed the data as listed No flowsheet data found.  No flowsheet data found.   PATHOLOGY: ***  BLOOD FILM: *** Review of the peripheral blood smear showed normal appearing white cells with neutrophils that were appropriately lobated and granulated. There was no predominance of bi-lobed or hyper-segmented neutrophils appreciated. No Dohle bodies were noted. There was no left shifting, immature forms or blasts noted. Lymphocytes remain normal in size without any predominance of large granular lymphocytes. Red cells show no anisopoikilocytosis, macrocytes , microcytes or polychromasia. There were no schistocytes, target cells, echinocytes, acanthocytes, dacrocytes, or stomatocytes.There was no rouleaux formation, nucleated red cells, or intra-cellular inclusions noted. The platelets are normal in size, shape, and color without any clumping evident.  RADIOGRAPHIC STUDIES: I have personally reviewed the radiological images as listed and agreed with the findings in the report. No results found.  ASSESSMENT & PLAN ***  No orders of the defined types were placed in this encounter.   All questions were answered. The patient knows to call the clinic with any problems, questions or concerns.  A total of more than {CHL ONC TIME VISIT - YOVZC:[id number]} were spent on this encounter and over half of that time was spent on counseling and coordination of care as outlined above.   Ulysees Barns, MD Department of Hematology/Oncology [facility] Phone: [phone] Pager: [phone] Email: [email or web address]  08/04/2020 11:54 AM

## 2020-09-15 ENCOUNTER — Encounter: Payer: Self-pay | Admitting: Hematology and Oncology

## 2020-09-15 ENCOUNTER — Inpatient Hospital Stay: Payer: 59 | Attending: Hematology and Oncology | Admitting: Hematology and Oncology

## 2020-09-15 ENCOUNTER — Other Ambulatory Visit: Payer: Self-pay

## 2020-09-15 ENCOUNTER — Inpatient Hospital Stay: Payer: 59

## 2020-09-15 VITALS — BP 129/68 | HR 64 | Temp 97.4°F | Resp 18 | Ht 70.0 in | Wt 238.4 lb

## 2020-09-15 DIAGNOSIS — D72829 Elevated white blood cell count, unspecified: Secondary | ICD-10-CM

## 2020-09-15 DIAGNOSIS — D72825 Bandemia: Secondary | ICD-10-CM

## 2020-09-15 DIAGNOSIS — Z800 Family history of malignant neoplasm of digestive organs: Secondary | ICD-10-CM | POA: Diagnosis not present

## 2020-09-15 DIAGNOSIS — F17210 Nicotine dependence, cigarettes, uncomplicated: Secondary | ICD-10-CM

## 2020-09-15 DIAGNOSIS — R61 Generalized hyperhidrosis: Secondary | ICD-10-CM | POA: Diagnosis not present

## 2020-09-15 LAB — LACTATE DEHYDROGENASE: LDH: 211 U/L — ABNORMAL HIGH (ref 98–192)

## 2020-09-15 LAB — CMP (CANCER CENTER ONLY)
ALT: 31 U/L (ref 0–44)
AST: 25 U/L (ref 15–41)
Albumin: 3.8 g/dL (ref 3.5–5.0)
Alkaline Phosphatase: 109 U/L (ref 38–126)
Anion gap: 8 (ref 5–15)
BUN: 11 mg/dL (ref 6–20)
CO2: 28 mmol/L (ref 22–32)
Calcium: 9.6 mg/dL (ref 8.9–10.3)
Chloride: 103 mmol/L (ref 98–111)
Creatinine: 0.79 mg/dL (ref 0.44–1.00)
GFR, Est AFR Am: >60 mL/min (ref >60)
GFR, Estimated: >60 mL/min (ref >60)
Glucose, Bld: 94 mg/dL (ref 70–99)
Potassium: 4.8 mmol/L (ref 3.5–5.1)
Sodium: 139 mmol/L (ref 135–145)
Total Bilirubin: 0.4 mg/dL (ref 0.3–1.2)
Total Protein: 8.1 g/dL (ref 6.5–8.1)

## 2020-09-15 LAB — CBC WITH DIFFERENTIAL (CANCER CENTER ONLY)
Abs Immature Granulocytes: 0.04 10*3/uL (ref 0.00–0.07)
Basophils Absolute: 0.1 10*3/uL (ref 0.0–0.1)
Basophils Relative: 1 %
Eosinophils Absolute: 0.5 10*3/uL (ref 0.0–0.5)
Eosinophils Relative: 4 %
HCT: 44.6 % (ref 36.0–46.0)
Hemoglobin: 15.1 g/dL — ABNORMAL HIGH (ref 12.0–15.0)
Immature Granulocytes: 0 %
Lymphocytes Relative: 27 %
Lymphs Abs: 3.8 10*3/uL (ref 0.7–4.0)
MCH: 30.6 pg (ref 26.0–34.0)
MCHC: 33.9 g/dL (ref 30.0–36.0)
MCV: 90.5 fL (ref 80.0–100.0)
Monocytes Absolute: 0.9 10*3/uL (ref 0.1–1.0)
Monocytes Relative: 7 %
Neutro Abs: 8.8 10*3/uL — ABNORMAL HIGH (ref 1.7–7.7)
Neutrophils Relative %: 61 %
Platelet Count: 286 10*3/uL (ref 150–400)
RBC: 4.93 MIL/uL (ref 3.87–5.11)
RDW: 14 % (ref 11.5–15.5)
WBC Count: 14.3 10*3/uL — ABNORMAL HIGH (ref 4.0–10.5)
nRBC: 0 % (ref 0.0–0.2)

## 2020-09-15 LAB — SAVE SMEAR(SSMR), FOR PROVIDER SLIDE REVIEW

## 2020-09-15 LAB — SEDIMENTATION RATE: Sed Rate: 38 mm/hr — ABNORMAL HIGH (ref 0–22)

## 2020-09-15 LAB — C-REACTIVE PROTEIN: CRP: 2.4 mg/dL — ABNORMAL HIGH (ref <1.0)

## 2020-09-15 NOTE — Progress Notes (Signed)
[place] [phone]   [phone]  INITIAL CONSULT NOTE  Patient Care Team: Patient, No Pcp Per as PCP - General (General Practice)  Hematological/Oncological History # Leukocytosis, Neutrophilia 1) 07/06/2020: WBC 14.1, Hgb 14.4, Plt 291, MCV 88.4, 61% neutrophils 2) 09/15/2020: establish care with Dr. Lorenso Courier   CHIEF COMPLAINTS/PURPOSE OF CONSULTATION:  "Leukocytosis, Neutrophilia"  HISTORY OF PRESENTING ILLNESS:  Stephanie Pugh 49 y.o. female with medical history significant for asthma, allergies, and tobacco use who presents for evaluation of leukocytosis.   On review of the previous records Stephanie Pugh last had labs drawn on 07/06/2020 at which time showed a white blood cell count of 87.8 with neutrophilic predominance, hemoglobin of 14.4, and a platelet count of 291.  Due to concern for this leukocytosis she was referred to hematology for further evaluation and management.  On exam today Stephanie Pugh notes that she has been aware of her elevated white blood cell count for approximately the last year.  She notes that she has never had issues before with her blood including anemia or bleeding issues.  She notes that she has had no infections in the last year including pneumonias, urinary tract infections, or sinus infections.  She does endorse having a rash on her knuckles bilaterally.  She also notes that she does not have any issues with joint pain, though she does have chronic issues with inflammation of the Achilles tendon which is not currently inflamed.  On further discussion she notes that she is a current active smoker and smoked for 32 years at approximately 1-1/2 packs/day.  She notes that she only occasionally drinks alcohol.  She currently works in a Camera operator.  Her family history is remarkable for father who died of pancreatic cancer in her mother who had COPD.  She currently has no children.  She notes that she does have  hot flashes, though she is going through menopause.  She notes that she feels fatigued all the time and she has no energy.  She was assessed for sleep apnea due to snoring and was not found to have a disorder.  She otherwise did not have any questions or concerns.  A full 10 point ROS is listed below.  MEDICAL HISTORY:  Past Medical History:  Diagnosis Date  . Allergy   . Asthma     SURGICAL HISTORY: Past Surgical History:  Procedure Laterality Date  . ACHILLES TENDON LENGTHENING Left 05/26/2011    SOCIAL HISTORY: Social History   Socioeconomic History  . Marital status: Single    Spouse name: Not on file  . Number of children: Not on file  . Years of education: Not on file  . Highest education level: Not on file  Occupational History  . Not on file  Tobacco Use  . Smoking status: Current Every Day Smoker  . Smokeless tobacco: Never Used  Vaping Use  . Vaping Use: Never used  Substance and Sexual Activity  . Alcohol use: No  . Drug use: Not on file  . Sexual activity: Not on file  Other Topics Concern  . Not on file  Social History Narrative  . Not on file   Social Determinants of Health   Financial Resource Strain:   . Difficulty of Paying Living Expenses: Not on file  Food Insecurity:   . Worried About Charity fundraiser in the Last Year: Not on file  . Ran Out of Food in the Last Year: Not on file  Transportation Needs:   . Film/video editor (Medical): Not on file  . Lack of Transportation (Non-Medical): Not on file  Physical Activity:   . Days of Exercise per Week: Not on file  . Minutes of Exercise per Session: Not on file  Stress:   . Feeling of Stress : Not on file  Social Connections:   . Frequency of Communication with Friends and Family: Not on file  . Frequency of Social Gatherings with Friends and Family: Not on file  . Attends Religious Services: Not on file  . Active Member of Clubs or Organizations: Not on file  . Attends Theatre manager Meetings: Not on file  . Marital Status: Not on file  Intimate Partner Violence:   . Fear of Current or Ex-Partner: Not on file  . Emotionally Abused: Not on file  . Physically Abused: Not on file  . Sexually Abused: Not on file    FAMILY HISTORY: History reviewed. No pertinent family history.  ALLERGIES:  is allergic to ampicillin.  MEDICATIONS:  Current Outpatient Medications  Medication Sig Dispense Refill  . ARIPiprazole (ABILIFY) 2 MG tablet Take 2 mg by mouth daily.    Marland Kitchen augmented betamethasone dipropionate (DIPROLENE-AF) 0.05 % ointment SMARTSIG:Sparingly Topical Twice Daily    . hydrOXYzine (VISTARIL) 50 MG capsule Take 50 mg by mouth daily as needed.    Marland Kitchen LORazepam (ATIVAN) 0.5 MG tablet Take 0.5 mg by mouth 2 (two) times daily as needed.    . sertraline (ZOLOFT) 100 MG tablet Take 100 mg by mouth daily.     No current facility-administered medications for this visit.    REVIEW OF SYSTEMS:   Constitutional: ( - ) fevers, ( - )  chills , ( + ) night sweats Eyes: ( - ) blurriness of vision, ( - ) double vision, ( - ) watery eyes Ears, nose, mouth, throat, and face: ( - ) mucositis, ( - ) sore throat Respiratory: ( - ) cough, ( - ) dyspnea, ( - ) wheezes Cardiovascular: ( - ) palpitation, ( - ) chest discomfort, ( - ) lower extremity swelling Gastrointestinal:  ( - ) nausea, ( - ) heartburn, ( - ) change in bowel habits Skin: ( + ) abnormal skin rashes Lymphatics: ( - ) new lymphadenopathy, ( - ) easy bruising Neurological: ( - ) numbness, ( - ) tingling, ( - ) new weaknesses Behavioral/Psych: ( - ) mood change, ( - ) new changes  All other systems were reviewed with the patient and are negative.  PHYSICAL EXAMINATION:  Vitals:   09/15/20 1401  BP: 129/68  Pulse: 64  Resp: 18  Temp: (!) 97.4 F (36.3 C)  SpO2: 96%   Filed Weights   09/15/20 1401  Weight: 238 lb 6.4 oz (108.1 kg)    GENERAL: well appearing middle aged Caucasian female in NAD    SKIN: Scaley rash over knuckles bilaterally. Otherwise skin color, texture, turgor are normal, no rashes or significant lesions EYES: conjunctiva are pink and non-injected, sclera clear LUNGS: clear to auscultation and percussion with normal breathing effort HEART: regular rate & rhythm and no murmurs and no lower extremity edema ABDOMEN: soft, non-tender, non-distended, normal bowel sounds Musculoskeletal: no cyanosis of digits and no clubbing  PSYCH: alert & oriented x 3, fluent speech NEURO: no focal motor/sensory deficits  LABORATORY DATA:  I have reviewed the data as listed CBC Latest Ref Rng & Units 09/15/2020  WBC 4.0 - 10.5 K/uL 14.3(H)  Hemoglobin  12.0 - 15.0 g/dL 15.1(H)  Hematocrit 36 - 46 % 44.6  Platelets 150 - 400 K/uL 286    CMP Latest Ref Rng & Units 09/15/2020  Glucose 70 - 99 mg/dL 94  BUN 6 - 20 mg/dL 11  Creatinine 0.44 - 1.00 mg/dL 0.79  Sodium 135 - 145 mmol/L 139  Potassium 3.5 - 5.1 mmol/L 4.8  Chloride 98 - 111 mmol/L 103  CO2 22 - 32 mmol/L 28  Calcium 8.9 - 10.3 mg/dL 9.6  Total Protein 6.5 - 8.1 g/dL 8.1  Total Bilirubin 0.3 - 1.2 mg/dL 0.4  Alkaline Phos 38 - 126 U/L 109  AST 15 - 41 U/L 25  ALT 0 - 44 U/L 31     BLOOD FILM:  Review of the peripheral blood smear showed normal appearing white cells with neutrophils that were appropriately lobated and granulated. There was no predominance of bi-lobed or hyper-segmented neutrophils appreciated. No Dohle bodies were noted. There was no left shifting, immature forms or blasts noted. Lymphocytes remain normal in size without any predominance of large granular lymphocytes. Red cells show no anisopoikilocytosis, macrocytes , microcytes or polychromasia. There were no schistocytes, target cells, echinocytes, acanthocytes, dacrocytes, or stomatocytes.There was no rouleaux formation, nucleated red cells, or intra-cellular inclusions noted. The platelets are normal in size, shape, and color without any clumping  evident.  RADIOGRAPHIC STUDIES: No results found.  ASSESSMENT & PLAN MCKINLEE DUNK 49 y.o. female with medical history significant for asthma, allergies, and tobacco use who presents for evaluation of leukocytosis.  After review the labs, review the records, discussion with the patient the findings are most consistent with a chronic leukocytosis of unclear etiology.  This leukocytosis is of neutrophilic predominance.  The etiology for these is often infectious, inflammatory, or bone marrow related.  The patient not currently taking any medications known to cause a leukocytosis.  Additionally she is not currently having any infectious symptoms.  Interestingly the patient does have an elevation of ESR and CRP as well as the rash on her knuckles bilaterally.  I do think that with her marked fatigue it would not be unreasonable to have the patient evaluated by rheumatology for consideration of an inflammatory condition.  In terms of myeloproliferative neoplasm I do believe this to be considerably less likely, though in the event that the patient's symptoms fail to resolve or worsen we could consider an MPN work-up.  This is less likely in the setting of her other counts being within normal limits.  If no other etiology can be found smoking can often be associated with a leukocytosis, particular neutrophilia.  As such I do believe that is the most likely etiology at this time, but in order to be sure I would recommend a rheumatological work-up in order to effectively rule out alternative causes.  We will plan to have the patient return in approximately 3 months time in order to reevaluate.  # Leukocytosis, Neutrophilia --findings are consistent with a chronic neutrophilic leukocytosis of unclear etiology --no infectious symptoms to suggest an infectious etiology --rash on hands coupled with prior tendon issues and elevated inflammatory markers is concerning for a systemic rheumatological condition. Recommend  referral to Rheumatology for further evaluation.  --findings may be 2/2 to the patient's heavy smoking habits. This would be diagnosis of exclusion. --plan for RTC in 3 months to re-evaluate.   Orders Placed This Encounter  Procedures  . CBC with Differential ([facility] Center Only)    Standing Status:   Future  Number of Occurrences:   1    Standing Expiration Date:   09/15/2021  . Save Smear (SSMR)    Standing Status:   Future    Number of Occurrences:   1    Standing Expiration Date:   09/15/2021  . CMP ([place] only)    Standing Status:   Future    Number of Occurrences:   1    Standing Expiration Date:   09/15/2021  . Lactate dehydrogenase (LDH)    Standing Status:   Future    Number of Occurrences:   1    Standing Expiration Date:   09/15/2021  . Sedimentation rate    Standing Status:   Future    Number of Occurrences:   1    Standing Expiration Date:   09/15/2021  . C-reactive protein    Standing Status:   Future    Number of Occurrences:   1    Standing Expiration Date:   09/15/2021  . Ambulatory referral to Rheumatology    Referral Priority:   Routine    Referral Type:   Consultation    Referral Reason:   Specialty Services Required    Requested Specialty:   Rheumatology    Number of Visits Requested:   1    All questions were answered. The patient knows to call the clinic with any problems, questions or concerns.  A total of more than 60 minutes were spent on this encounter and over half of that time was spent on counseling and coordination of care as outlined above.   Ledell Peoples, MD Department of Hematology/Oncology [place] at [facility] Phone: [phone] Pager: [phone] Email: [email or web address]  09/15/2020 5:40 PM

## 2020-09-16 ENCOUNTER — Telehealth: Payer: Self-pay | Admitting: Hematology and Oncology

## 2020-09-16 NOTE — Telephone Encounter (Signed)
Scheduled per los. Called and left msg. Mailed printout  °

## 2020-09-19 ENCOUNTER — Telehealth: Payer: Self-pay | Admitting: *Deleted

## 2020-09-19 NOTE — Telephone Encounter (Signed)
-----  Message from Orson Slick, MD sent at 09/18/2020  2:50 PM EDT ----- Stephanie Pugh's labs showed an increase in inflammatory markers (ESR/CRP). This, coupled with the rash on her hands make me concerned for a possible rheumatological disorder. I would recommend consult to rheumatology.  If that workup as otherwise negative, smoking would be the second most likely possible cause.   No evidence on our workup of a bone marrow disorder or other blood problem.  ----- Message ----- From: Buel Ream, Lab In [place] Sent: 09/15/2020   2:56 PM EDT To: Orson Slick, MD

## 2020-09-19 NOTE — Telephone Encounter (Signed)
TCT patient regarding her recent lab results.  Spoke with patient and advised that her labs revealed a possible inflammatory process and has recommended a rheumatology referral.  Advised that he sent that referral in on the 16th of September.  He also advised that if that work up was negative that he believes that smoking is another likely cause. He recommends her to quit smoking. No evidence of a bone marrow disorder or other blood problem.Advised that she does not need to follow up in this clinic. She voiced understanding

## 2020-10-19 NOTE — Progress Notes (Signed)
Office Visit Note  Patient: Stephanie Pugh             Date of Birth: [birth year]           MRN: [id number]             PCP: Janie Morning, DO Referring: Orson Slick, MD Visit Date: 10/20/2020  Subjective:  New Patient (Initial Visit), Abnormal Lab, and Rash (bilateral hands)   History of Present Illness: Stephanie Pugh is a 49 y.o. female here for evaluation of skin rashes and neutrophilia with elevated ESR and CRP. She has been seen in oncology clinic for workup of the persistent mild leukocytosis without any clear findings of hematological disorder. She has had elevated serum inflammatory markers and leukocytosis as well as slightly elevated LDH. Since last year she has noticed increased rash over the backs of her knuckles and inside edge of her hands with erythema and skin cracking. She has also noticed small areas of rash on knees and elbows. These rashes are not itchy nor painful. Besides this skin rash she also describes joint pains and fatigue. The fatigue and arthralgias predate the skin rash. She has had previous left achilles tendon lengthening surgery and multiple tendon rupture so has residual weakness of the joint and pain in the peroneal tendon. These also all predate the skin rashes by years. She has not noticed significant hair loss, eye redness, oral ulcers, lymphadenopathy, raynaud's phenomenon, cough, difficulty swallowing and no muscle weakness.  Labs reviewed 08/2020 LDH 211 WBC 14.3 ESR 36 CRP 2.4  Activities of Daily Living:  Patient reports morning stiffness for 0 minutes.   Patient Denies nocturnal pain.  Difficulty dressing/grooming: Denies Difficulty climbing stairs: Reports Difficulty getting out of chair: Reports Difficulty using hands for taps, buttons, cutlery, and/or writing: Denies  Review of Systems  Constitutional: Positive for fatigue.  HENT: Negative for mouth sores, mouth dryness and nose dryness.   Eyes: Negative for pain, itching, visual  disturbance and dryness.  Respiratory: Negative for cough, hemoptysis, shortness of breath and difficulty breathing.   Cardiovascular: Positive for palpitations. Negative for chest pain and swelling in legs/feet.  Gastrointestinal: Negative for abdominal pain, blood in stool, constipation and diarrhea.  Endocrine: Negative for increased urination.  Genitourinary: Negative for painful urination.  Musculoskeletal: Positive for arthralgias, joint pain, joint swelling, myalgias, muscle weakness, muscle tenderness and myalgias. Negative for morning stiffness.  Skin: Positive for rash and redness. Negative for color change.  Allergic/Immunologic: Negative for susceptible to infections.  Neurological: Negative for dizziness, numbness, headaches and memory loss.  Hematological: Negative for swollen glands.  Psychiatric/Behavioral: Positive for sleep disturbance. Negative for confusion. The patient is nervous/anxious.     PMFS History:  Patient Active Problem List   Diagnosis Date Noted  . Neutrophilia 10/20/2020  . Elevated sed rate 10/20/2020  . Rash and other nonspecific skin eruption 10/20/2020  . Left Achilles tendinitis 06/23/2018    Past Medical History:  Diagnosis Date  . Allergy   . Asthma     Family History  Problem Relation Age of Onset  . COPD Mother   . Heart disease Mother   . Pancreatic cancer Father   . Diabetes Father   . Heart attack Sister    Past Surgical History:  Procedure Laterality Date  . ACHILLES TENDON LENGTHENING Left 05/26/2011  . TONSILLECTOMY     Per patient   Social History   Social History Narrative  . Not on file   Immunization  History  Administered Date(s) Administered  . Moderna SARS-COVID-2 Vaccination 04/30/2020, 05/31/2020     Objective: Vital Signs: BP 109/60 (BP Location: Right Arm, Patient Position: Sitting, Cuff Size: Small)   Pulse 68   Ht $R'5\' 8"'HI$  (1.727 m)   Wt 239 lb (108.4 kg)   BMI 36.34 kg/m    Physical  Exam Constitutional:      Appearance: Normal appearance.  HENT:     Head: Normocephalic.     Right Ear: External ear normal.     Left Ear: External ear normal.     Nose: Nose normal.     Mouth/Throat:     Mouth: Mucous membranes are moist.     Pharynx: Oropharynx is clear.     Comments: Upper dentures Eyes:     Conjunctiva/sclera: Conjunctivae normal.     Pupils: Pupils are equal, round, and reactive to light.  Cardiovascular:     Rate and Rhythm: Normal rate and regular rhythm.  Pulmonary:     Effort: Pulmonary effort is normal.     Breath sounds: Normal breath sounds.  Skin:    General: Skin is warm and dry.     Comments: Mildly erythematous, scaly and cracked skin on the dorsum of MCP joints and PIP joints with some involvement of skin between the joint, and along radial aspect of 2nd digit on both hands  Neurological:     Mental Status: She is alert.      Musculoskeletal Exam:  Neck full range of motion no tenderness Shoulder, elbow, wrist, fingers full range of motion no tenderness or swelling No paraspinal tenderness to palpation over upper and lower back Normal hip internal and external rotation without pain, no tenderness to lateral hip palpation Knees patellofemoral crepitus is present bilaterally Left achilles tendon with well healed scar and nodule is present, heel plantarflexion is intact, right ankle normal Normal MTP joints without tenderness or swelling  CDAI Exam: CDAI Score: -- Patient Global: --; Provider Global: -- Swollen: --; Tender: -- Joint Exam 10/20/2020   No joint exam has been documented for this visit   There is currently no information documented on the homunculus. Go to the Rheumatology activity and complete the homunculus joint exam.  Investigation: No additional findings.  Imaging: No results found.  Recent Labs: Lab Results  Component Value Date   WBC 14.3 (H) 09/15/2020   HGB 15.1 (H) 09/15/2020   PLT 286 09/15/2020   NA 139  09/15/2020   K 4.8 09/15/2020   CL 103 09/15/2020   CO2 28 09/15/2020   GLUCOSE 94 09/15/2020   BUN 11 09/15/2020   CREATININE 0.79 09/15/2020   BILITOT 0.4 09/15/2020   ALKPHOS 109 09/15/2020   AST 25 09/15/2020   ALT 31 09/15/2020   PROT 8.1 09/15/2020   ALBUMIN 3.8 09/15/2020   CALCIUM 9.6 09/15/2020   GFRAA >60 09/15/2020    Speciality Comments: No specialty comments available.  Procedures:  No procedures performed Allergies: Ampicillin   Assessment / Plan:     Visit Diagnoses: Rash and other nonspecific skin eruption - Plan: Myositis Assessr Plus Jo-1 Autoabs, CK, ANA  Her skin rash does not suggest cutaneous manifestation of vasculitis and denies photosensitive rash or other clinical criteria of SLE. The pattern of skin involvement could be related to severe dry skin but. From autoimmune standpoint distribution similar to dermatomyositis on hands but does not have truncal involvement. Given the positive inflammatory markers and LDH though will screen with myositis Abs, ANA, and CK.  If positive will plan to further evaluate but if negative I would have low suspicion for inflammatory autoimmune disease at that time.  Neutrophilia Elevated sed rate  Unclear cause of serologic markers of inflammation. No inflammatory joint disease, evidence of organ system involvement, skin involvement being evaluated as above.  Orders: Orders Placed This Encounter  Procedures  . Myositis Assessr Plus Jo-1 Autoabs  . CK  . ANA   No orders of the defined types were placed in this encounter.    Follow-Up Instructions: No follow-ups on file.   Collier Salina, MD  Note - This record has been created using [employer].  Chart creation errors have been sought, but may not always  have been located. Such creation errors do not reflect on  the standard of medical care.

## 2020-10-20 ENCOUNTER — Ambulatory Visit: Payer: 59 | Admitting: Internal Medicine

## 2020-10-20 ENCOUNTER — Encounter: Payer: Self-pay | Admitting: Internal Medicine

## 2020-10-20 ENCOUNTER — Other Ambulatory Visit: Payer: Self-pay

## 2020-10-20 VITALS — BP 109/60 | HR 68 | Ht 68.0 in | Wt 239.0 lb

## 2020-10-20 DIAGNOSIS — R700 Elevated erythrocyte sedimentation rate: Secondary | ICD-10-CM

## 2020-10-20 DIAGNOSIS — R21 Rash and other nonspecific skin eruption: Secondary | ICD-10-CM

## 2020-10-20 DIAGNOSIS — D729 Disorder of white blood cells, unspecified: Secondary | ICD-10-CM | POA: Diagnosis not present

## 2020-10-20 DIAGNOSIS — D72828 Other elevated white blood cell count: Secondary | ICD-10-CM

## 2020-10-20 NOTE — Patient Instructions (Signed)
Overall I think you probably do not have a systemic inflammatory disease but will check several blood tests for evidence to make sure. If these are all negative I think we do not need any treatment or follow up scheduled. If tests are indicative for a disease we may need additional tests.  Our office will contact you after seeing these results in a few days whether we need to follow up or not.

## 2020-10-27 LAB — MYOSITIS ASSESSR PLUS JO-1 AUTOABS
EJ Autoabs: NOT DETECTED
Jo-1 Autoabs: <1 AI
Ku Autoabs: NOT DETECTED
Mi-2 Autoabs: NOT DETECTED
OJ Autoabs: NOT DETECTED
PL-12 Autoabs: NOT DETECTED
PL-7 Autoabs: NOT DETECTED
SRP Autoabs: NOT DETECTED

## 2020-10-27 LAB — ANTI-NUCLEAR AB-TITER (ANA TITER): ANA Titer 1: 1:80 {titer} — ABNORMAL HIGH

## 2020-10-27 LAB — ANA: Anti Nuclear Antibody (ANA): POSITIVE — AB

## 2020-10-27 LAB — CK: Total CK: 50 U/L (ref 29–143)

## 2020-11-04 ENCOUNTER — Telehealth: Payer: Self-pay | Admitting: Internal Medicine

## 2020-11-04 ENCOUNTER — Ambulatory Visit: Payer: Self-pay

## 2020-11-04 DIAGNOSIS — R21 Rash and other nonspecific skin eruption: Secondary | ICD-10-CM

## 2020-11-04 DIAGNOSIS — D729 Disorder of white blood cells, unspecified: Secondary | ICD-10-CM

## 2020-11-04 NOTE — Telephone Encounter (Signed)
I spoke with Stephanie Pugh regarding her labs. We discussed it most likely does not reflect autoimmune disease based on results and clinical picture. Her pattern of hand rash can be associated with inflammatory disease involving the lungs, although the labs suggest not. Recommended getting chest xray to better exclude this she agrees so will order to have this set up.

## 2020-11-04 NOTE — Telephone Encounter (Signed)
Contact patient and advised her order has been placed for chest xray at [facility] and she can go on a walk in basis during regular business hours. Patient verbalized understanding.

## 2020-11-04 NOTE — Telephone Encounter (Signed)
Patient was calling back in reference to lab results from her appointment on 10/20/2020. Please advise.

## 2020-11-17 ENCOUNTER — Other Ambulatory Visit: Payer: Self-pay | Admitting: Internal Medicine

## 2020-11-17 ENCOUNTER — Other Ambulatory Visit: Payer: Self-pay | Admitting: *Deleted

## 2020-11-17 ENCOUNTER — Ambulatory Visit
Admission: RE | Admit: 2020-11-17 | Discharge: 2020-11-17 | Disposition: A | Discharge status: Home / Self Care | Payer: 59 | Source: Ambulatory Visit | Attending: Internal Medicine | Admitting: Internal Medicine

## 2020-11-17 DIAGNOSIS — D729 Disorder of white blood cells, unspecified: Secondary | ICD-10-CM

## 2020-11-17 DIAGNOSIS — R21 Rash and other nonspecific skin eruption: Secondary | ICD-10-CM

## 2020-12-13 ENCOUNTER — Telehealth: Payer: Self-pay | Admitting: Hematology and Oncology

## 2020-12-13 NOTE — Telephone Encounter (Signed)
Called pt per 12/14 sch msg - left message for pt to call back to reschedule.

## 2020-12-15 ENCOUNTER — Encounter: Payer: 59 | Admitting: Hematology and Oncology

## 2020-12-15 ENCOUNTER — Other Ambulatory Visit: Payer: 59

## 2020-12-15 ENCOUNTER — Other Ambulatory Visit (HOSPITAL_BASED_OUTPATIENT_CLINIC_OR_DEPARTMENT_OTHER): Payer: 59 | Admitting: Hematology and Oncology

## 2020-12-15 ENCOUNTER — Other Ambulatory Visit: Payer: Self-pay | Admitting: Hematology and Oncology

## 2020-12-15 DIAGNOSIS — R700 Elevated erythrocyte sedimentation rate: Secondary | ICD-10-CM

## 2020-12-15 DIAGNOSIS — D72825 Bandemia: Secondary | ICD-10-CM | POA: Diagnosis not present

## 2020-12-15 DIAGNOSIS — D729 Disorder of white blood cells, unspecified: Secondary | ICD-10-CM | POA: Diagnosis not present

## 2020-12-15 NOTE — Progress Notes (Signed)
No show

## 2021-07-06 IMAGING — DX DG CHEST 2V
2 series · 2 of 2 positions shown · non-contrast
Comparison: None.

CLINICAL DATA: Screening for ILD. Neutrophilia with bilateral hand
rash for 1 year.

EXAM:
CHEST - 2 VIEW

[dg chest 2 view (1 of 2)]
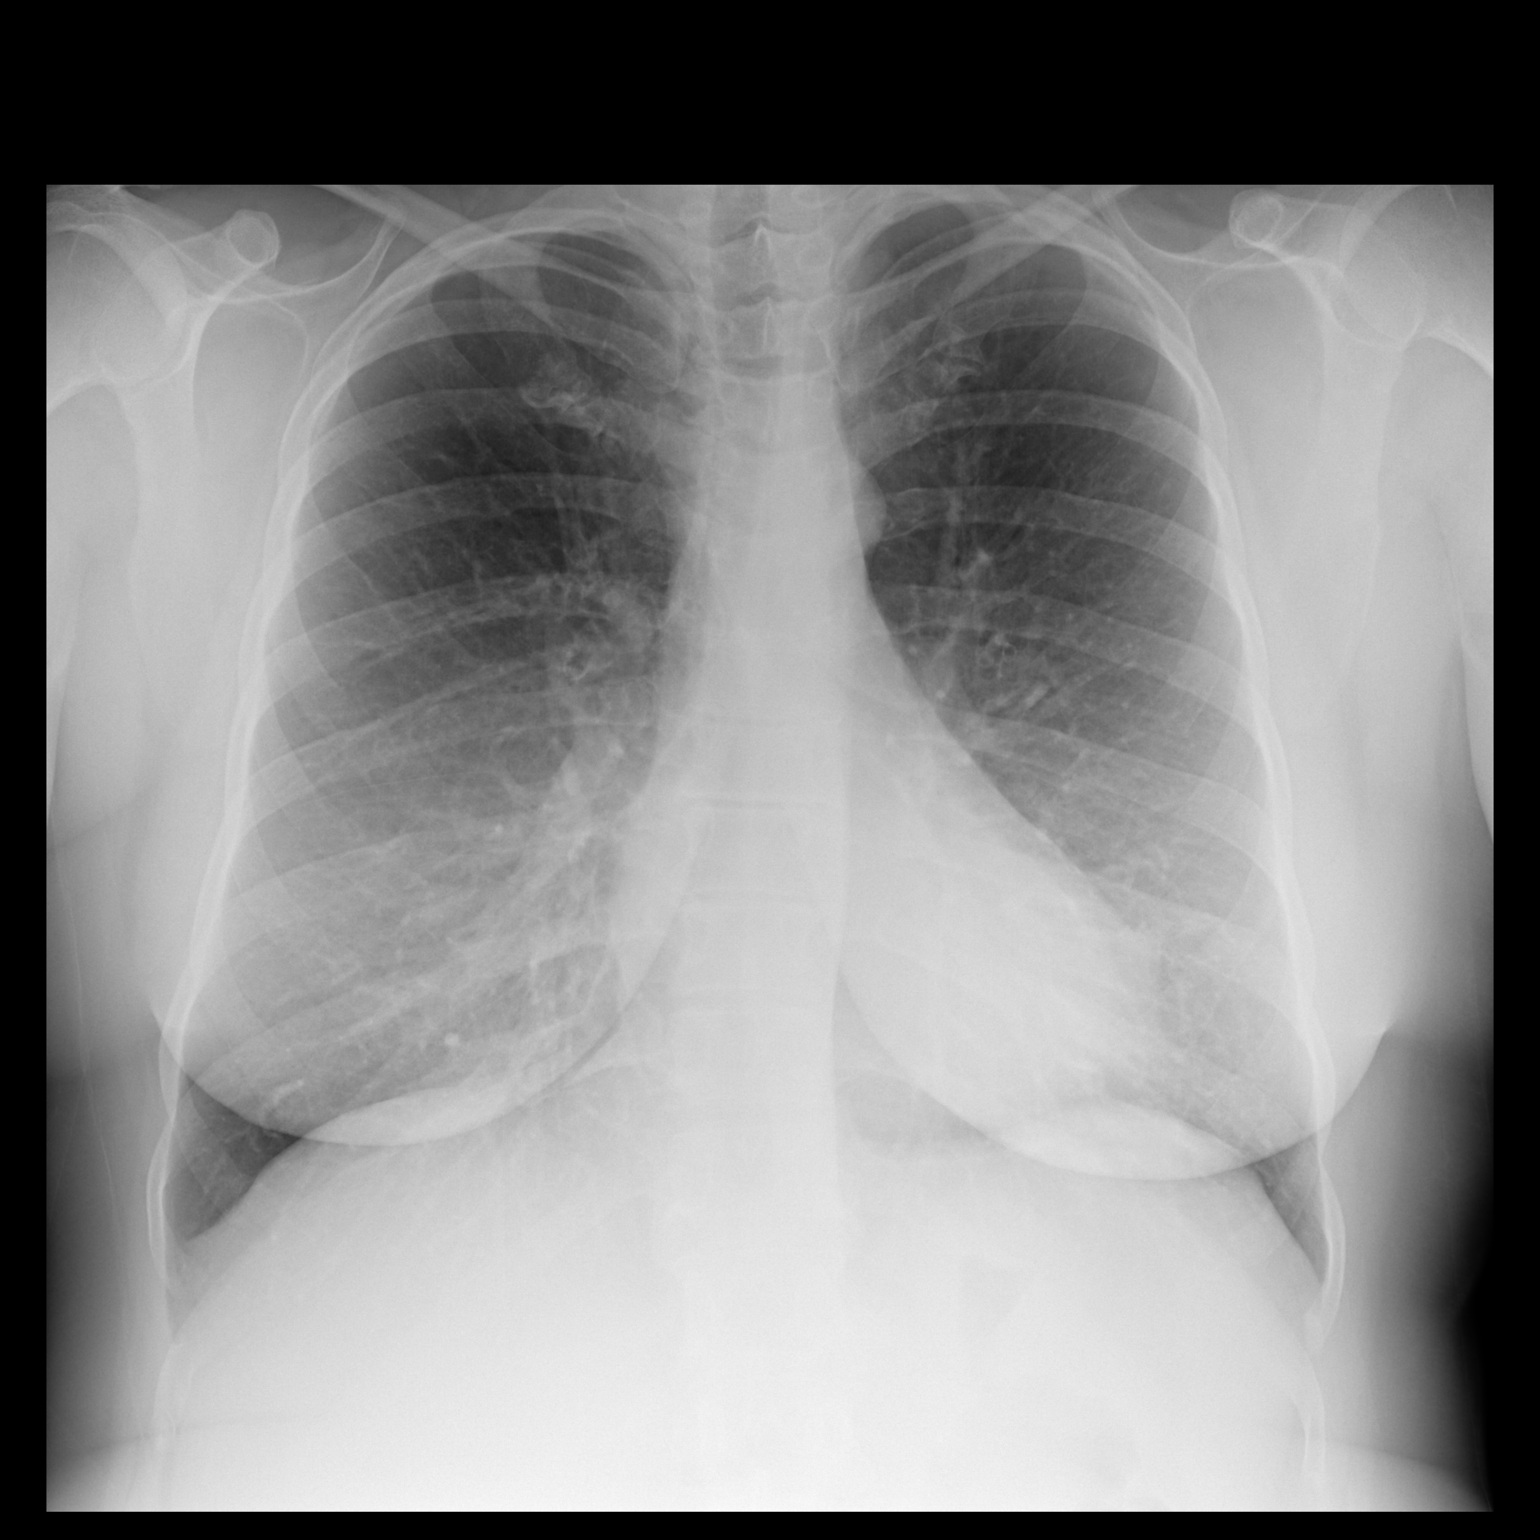

[dg chest 2 view (2 of 2)]
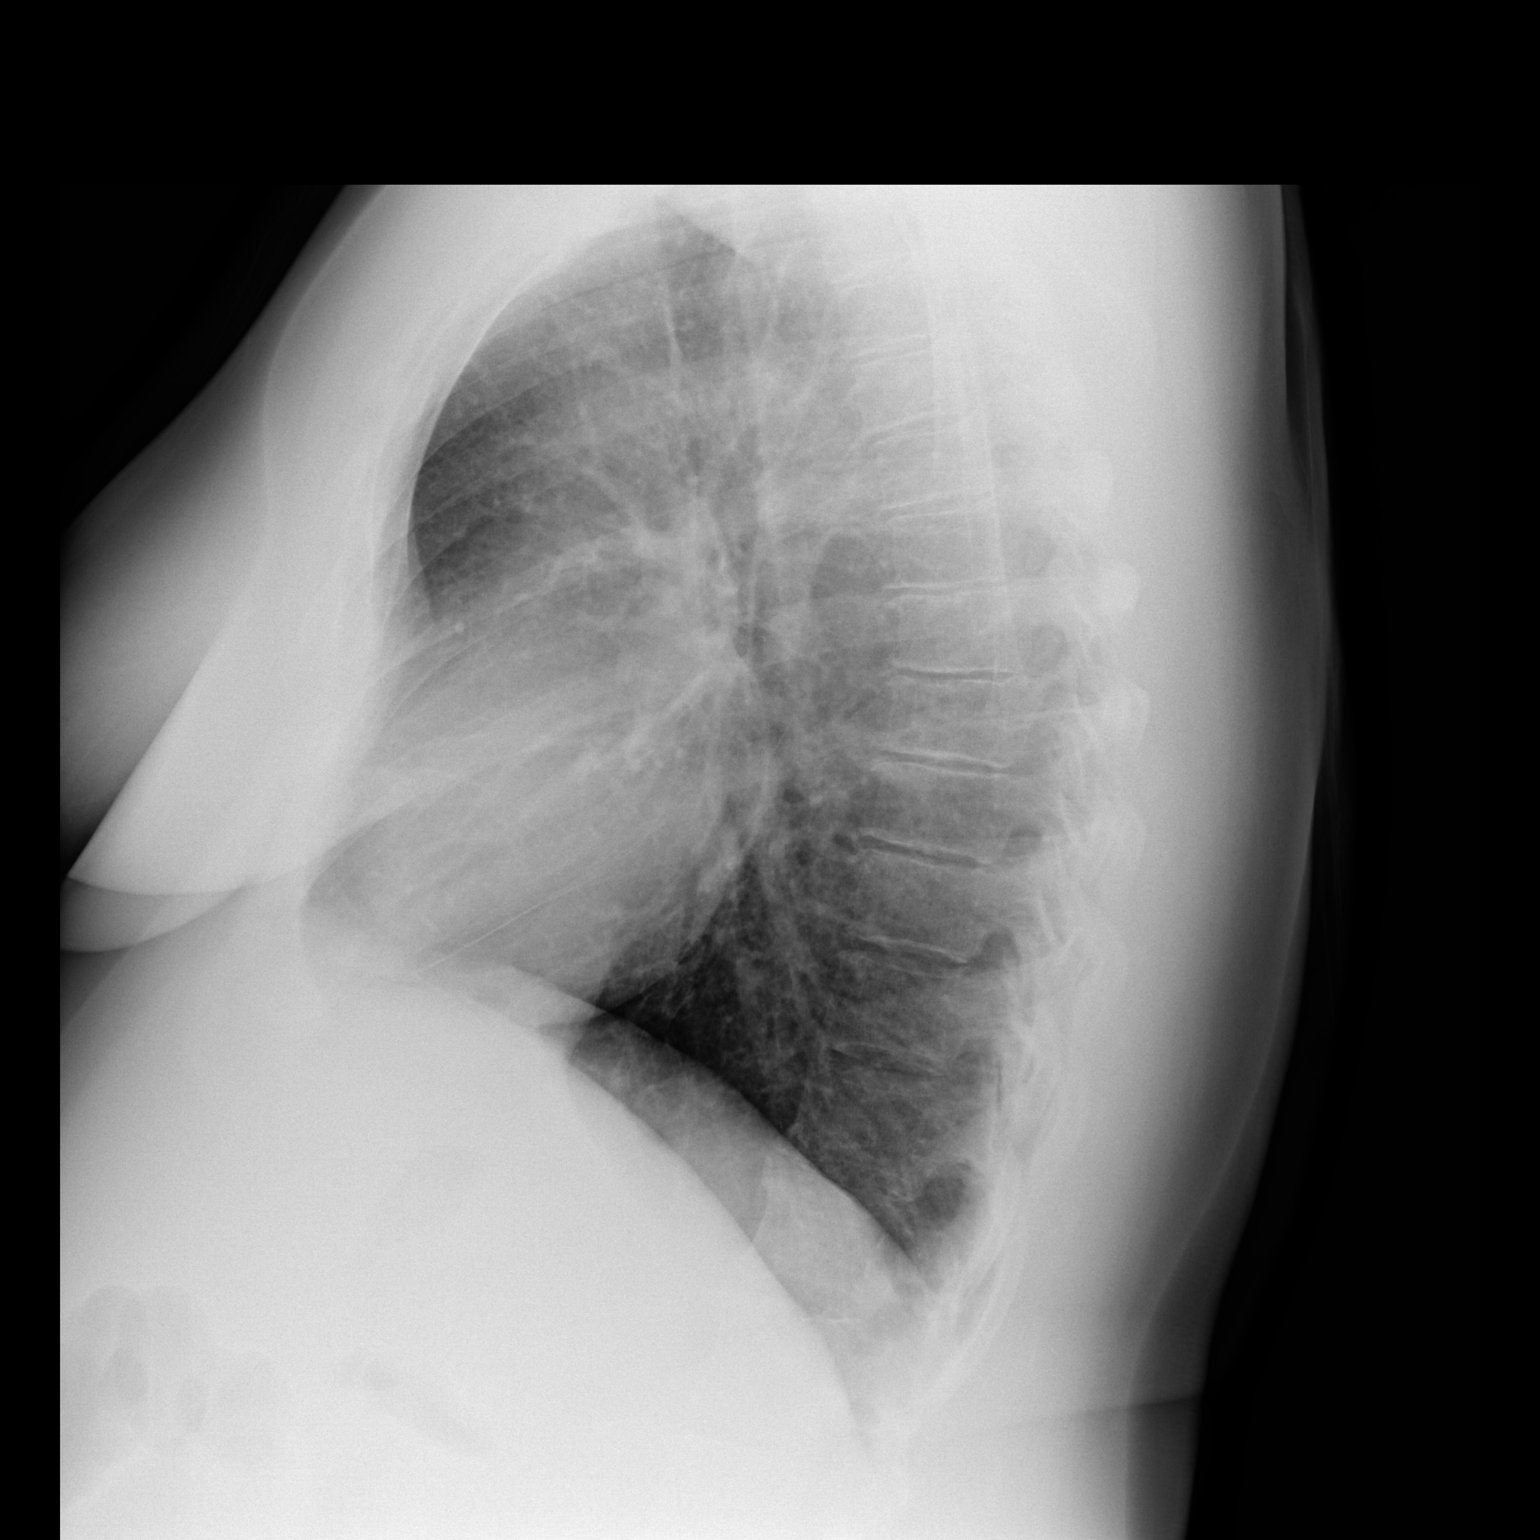

[2 of 2 positions shown; findings below may reference images not displayed]

FINDINGS: Lungs symmetrically inflated.The cardiomediastinal contours are
normal. No honeycombing or reticular changes. Pulmonary vasculature
is normal. No consolidation, pleural effusion, or pneumothorax. No
acute osseous abnormalities are seen.
IMPRESSION: Negative radiographs of the chest. No evidence of interstitial lung
disease.

## 2024-12-22 DIAGNOSIS — Z0279 Encounter for issue of other medical certificate: Secondary | ICD-10-CM

## 2025-01-01 ENCOUNTER — Ambulatory Visit: Admitting: Podiatry

## 2025-01-14 ENCOUNTER — Ambulatory Visit: Admitting: Podiatry

## 2025-01-14 ENCOUNTER — Encounter: Payer: Self-pay | Admitting: Podiatry

## 2025-01-14 DIAGNOSIS — M7671 Peroneal tendinitis, right leg: Secondary | ICD-10-CM

## 2025-01-14 MED ORDER — TRIAMCINOLONE ACETONIDE 10 MG/ML IJ SUSP
10.0000 mg | Freq: Once | INTRAMUSCULAR | Status: AC
  Administered 2025-01-14: 10 mg

## 2025-01-14 NOTE — Progress Notes (Signed)
 Complaining of pain along the lateral aspect of the ankle and foot.  Indicates along the course of the peroneal tendon right.  No injury to it.  Had noticed any redness or ecchymosis.  No significant swelling noted.   Physical exam:  General appearance: Pleasant, and in no acute distress. AOx3.  Vascular: Pedal pulses: DP 2/4 bilaterally, PT 2/4 bilaterally.  Mild edema lower legs bilaterally. Capillary fill time immediate bilaterally.  Neurological: Light touch intact feet bilaterally.  Normal Achilles reflex bilaterally.  No clonus or spasticity noted.   negative Tinel sign at sural nerve right  Dermatologic:   Skin normal temperature bilaterally.  Skin normal color, tone, and texture bilaterally.   Musculoskeletal: Tenderness along peroneal tendons from the peroneal tubercle proximally to the distal aspect of the malleolus and some tenderness retromalleolar right.  Some tenderness in the sinus tarsi right.   Diagnosis: 1.  Peroneal tenosynovitis right  Plan: -Discussed over the tendinitis recommended RICE.  Wear good stable shoe. - Injected 3cc 2:1 mixture 0.5 cc Marcaine: Triamcinolone  40mg /89ml at peroneal tendon sheath at peroneal tubercle right   Return 2 weeks follow-up injection peroneus brevis tendon sheath right

## 2025-01-15 ENCOUNTER — Telehealth: Payer: Self-pay | Admitting: Podiatry

## 2025-01-15 NOTE — Telephone Encounter (Signed)
 s/w pt and she will need intermittent 2-3 days per week 12hr per episode 01/14/25-03/31/25 and faxed to [facility] [phone]

## 2025-01-19 ENCOUNTER — Telehealth: Payer: Self-pay | Admitting: Podiatry

## 2025-01-19 NOTE — Telephone Encounter (Signed)
 s/w pt and adv page 2 to be updated, and added eff dates for intermittent. refaxed to [facility] [phone]

## 2025-01-29 ENCOUNTER — Ambulatory Visit: Admitting: Podiatry
